# Patient Record
Sex: Female | Born: 1965 | Race: White | Hispanic: Yes | Marital: Married | Smoking: Never smoker
Health system: Southern US, Community
[De-identification: ages and names within clinical notes are randomized; demographics above are authoritative.]

## PROBLEM LIST (undated history)

## (undated) DIAGNOSIS — G43909 Migraine, unspecified, not intractable, without status migrainosus: Secondary | ICD-10-CM

## (undated) HISTORY — DX: Migraine, unspecified, not intractable, without status migrainosus: G43.909

## (undated) HISTORY — PX: APPENDECTOMY: SHX54

---

## 1998-05-29 ENCOUNTER — Encounter: Payer: Self-pay | Admitting: Family Medicine

## 1998-05-29 ENCOUNTER — Ambulatory Visit (HOSPITAL_COMMUNITY): Admission: RE | Admit: 1998-05-29 | Discharge: 1998-05-29 | Payer: Self-pay | Admitting: Family Medicine

## 1998-07-26 ENCOUNTER — Other Ambulatory Visit: Admission: RE | Admit: 1998-07-26 | Discharge: 1998-07-26 | Payer: Self-pay | Admitting: Obstetrics and Gynecology

## 1999-01-24 ENCOUNTER — Inpatient Hospital Stay (HOSPITAL_COMMUNITY): Admission: AD | Admit: 1999-01-24 | Discharge: 1999-01-27 | Payer: Self-pay | Admitting: Obstetrics and Gynecology

## 1999-01-28 ENCOUNTER — Encounter (HOSPITAL_COMMUNITY): Admission: RE | Admit: 1999-01-28 | Discharge: 1999-04-28 | Payer: Self-pay | Admitting: Obstetrics and Gynecology

## 1999-03-09 ENCOUNTER — Other Ambulatory Visit: Admission: RE | Admit: 1999-03-09 | Discharge: 1999-03-09 | Payer: Self-pay | Admitting: Obstetrics & Gynecology

## 2000-08-22 ENCOUNTER — Ambulatory Visit (HOSPITAL_COMMUNITY): Admission: RE | Admit: 2000-08-22 | Discharge: 2000-08-22 | Payer: Self-pay | Admitting: Family Medicine

## 2000-08-22 ENCOUNTER — Encounter: Payer: Self-pay | Admitting: Family Medicine

## 2000-08-22 ENCOUNTER — Other Ambulatory Visit: Admission: RE | Admit: 2000-08-22 | Discharge: 2000-08-22 | Payer: Self-pay | Admitting: Obstetrics and Gynecology

## 2002-01-05 ENCOUNTER — Encounter: Payer: Self-pay | Admitting: Family Medicine

## 2002-01-05 ENCOUNTER — Encounter: Admission: RE | Admit: 2002-01-05 | Discharge: 2002-01-05 | Payer: Self-pay | Admitting: Family Medicine

## 2005-03-08 ENCOUNTER — Encounter: Admission: RE | Admit: 2005-03-08 | Discharge: 2005-03-08 | Payer: Self-pay | Admitting: Family Medicine

## 2005-03-20 ENCOUNTER — Encounter: Admission: RE | Admit: 2005-03-20 | Discharge: 2005-03-20 | Payer: Self-pay | Admitting: Family Medicine

## 2007-03-03 ENCOUNTER — Encounter: Admission: RE | Admit: 2007-03-03 | Discharge: 2007-03-03 | Payer: Self-pay | Admitting: Specialist

## 2010-06-26 ENCOUNTER — Encounter
Admission: RE | Admit: 2010-06-26 | Discharge: 2010-06-26 | Payer: Self-pay | Source: Home / Self Care | Attending: Family Medicine | Admitting: Family Medicine

## 2012-08-24 ENCOUNTER — Encounter: Payer: Self-pay | Admitting: Women's Health

## 2012-08-24 ENCOUNTER — Other Ambulatory Visit (HOSPITAL_COMMUNITY)
Admission: RE | Admit: 2012-08-24 | Discharge: 2012-08-24 | Disposition: A | Discharge status: Home / Self Care | Payer: Managed Care, Other (non HMO) | Source: Ambulatory Visit | Attending: Women's Health | Admitting: Women's Health

## 2012-08-24 ENCOUNTER — Ambulatory Visit (INDEPENDENT_AMBULATORY_CARE_PROVIDER_SITE_OTHER): Payer: Managed Care, Other (non HMO) | Admitting: Women's Health

## 2012-08-24 VITALS — BP 116/70 | Ht 59.25 in | Wt 113.0 lb

## 2012-08-24 DIAGNOSIS — Z1151 Encounter for screening for human papillomavirus (HPV): Secondary | ICD-10-CM | POA: Insufficient documentation

## 2012-08-24 DIAGNOSIS — Z13220 Encounter for screening for lipoid disorders: Secondary | ICD-10-CM

## 2012-08-24 DIAGNOSIS — Z01419 Encounter for gynecological examination (general) (routine) without abnormal findings: Secondary | ICD-10-CM

## 2012-08-24 DIAGNOSIS — E079 Disorder of thyroid, unspecified: Secondary | ICD-10-CM

## 2012-08-24 DIAGNOSIS — N898 Other specified noninflammatory disorders of vagina: Secondary | ICD-10-CM

## 2012-08-24 DIAGNOSIS — N899 Noninflammatory disorder of vagina, unspecified: Secondary | ICD-10-CM

## 2012-08-24 DIAGNOSIS — Z833 Family history of diabetes mellitus: Secondary | ICD-10-CM

## 2012-08-24 LAB — CBC WITH DIFFERENTIAL/PLATELET
Basophils Relative: 0 % (ref 0–1)
Eosinophils Absolute: 0 10*3/uL (ref 0.0–0.7)
Hemoglobin: 14.2 g/dL (ref 12.0–15.0)
MCH: 30.7 pg (ref 26.0–34.0)
MCHC: 34 g/dL (ref 30.0–36.0)
Monocytes Absolute: 0.3 10*3/uL (ref 0.1–1.0)
Monocytes Relative: 5 % (ref 3–12)
Neutrophils Relative %: 67 % (ref 43–77)

## 2012-08-24 LAB — GLUCOSE, RANDOM: Glucose, Bld: 94 mg/dL (ref 70–99)

## 2012-08-24 LAB — LIPID PANEL
Total CHOL/HDL Ratio: 3.2 Ratio
VLDL: 13 mg/dL (ref 0–40)

## 2012-08-24 LAB — WET PREP FOR TRICH, YEAST, CLUE: Clue Cells Wet Prep HPF POC: NONE SEEN

## 2012-08-24 LAB — TSH: TSH: 0.999 u[IU]/mL (ref 0.350–4.500)

## 2012-08-24 MED ORDER — NYSTATIN 100000 UNIT/GM EX CREA: TOPICAL_CREAM | Freq: Two times a day (BID) | CUTANEOUS | Status: DC

## 2012-08-24 NOTE — Progress Notes (Signed)
Beth Wilkerson 1966-03-17 [id number]    History:    New patient presents for annual exam.  21 day cycles, 10 day light flow x 2 cylces/condoms. C/o increased perineal itchiness/irritation x 2 months. Constipation whole life, relieved with Green Tea. Reports scant red rectal layer on toilet tissue occasionally. Pt denies any break through bleeding. Hx Normal PAPs and Mammograms per patient.  Migraine Hx with negative brain scan (2009).   Past medical history, past surgical history, family history and social history were all reviewed and documented in the EPIC chart. Unfaithful husband, 3 yrs ago, negative STI screening after. 73 yo son, Jeanene Erb series completed. 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 Bladder CA (M), living.    ROS:  A  ROS was performed and pertinent positives and negatives are included in the history.  Exam:  Filed Vitals:   08/24/12 0939  BP: 116/70    General appearance:  Normal Head/Neck:  Normal, without cervical or supraclavicular adenopathy. Thyroid:  Symmetrical, normal in size, without palpable masses or nodularity. Respiratory  Effort:  Normal  Auscultation:  Clear without wheezing or rhonchi Cardiovascular  Auscultation:  Regular rate, without rubs, murmurs or gallops  Edema/varicosities:  Not grossly evident Abdominal  Soft,nontender, without masses, guarding or rebound.  Liver/spleen:  No organomegaly noted  Hernia:  None appreciated  Skin  Inspection:  Grossly normal  Palpation:  Grossly normal Neurologic/psychiatric  Orientation:  Normal with appropriate conversation.  Mood/affect:  Normal  Genitourinary    Breasts: Examined lying and sitting.     Right: Without masses, retractions, discharge or axillary adenopathy.     Left: Without masses, retractions, discharge or axillary adenopathy.   Inguinal/mons:  Normal without inguinal adenopathy  External genitalia:  Erythematous from vaginal introitus to  rectal area  BUS/Urethra/Skene's glands:  Normal  Bladder:  Normal  Vagina:  Normal wet prep negative  Cervix:  Normal  Uterus:  normal in size, shape and contour.  Midline and mobile  Adnexa/parametria:     Rt: Without masses or tenderness.   Lt: Without masses or tenderness.  Anus and perineum: Hemorrhoids  Digital rectal exam: Normal sphincter tone without palpated masses or tenderness  Assessment/Plan:  47 y.o.  M HF G1P1 for annual exam with c/o perineal irritation x 2 months.    Perineal irritation Light cycleCycle lasting 10 days x2 months  Plan: PAP,  new guidelines reviewed. Rx Mycolog Cream BID for perineal irritation, instructed to call if no relief. Hemoccult card and instructions given, if positive will schedule colonoscopy.reviewed importance of continuing high fiber diet, green tea to prevent constipation. Sonohysterogram if next cycle > 7 days. Discussed importance of continuing healthy diet, exercise, high fibrous foods, Calcium, Vitamin D, annual mammograms (2012 Normal). Contraception options reviewed, declines will continue condoms. CBC, Lipid pane., TSH, Glucose, UA.  Harrington Challenger WHNP, 10:26 AM 08/24/2012

## 2012-08-24 NOTE — Patient Instructions (Signed)

## 2012-08-25 ENCOUNTER — Telehealth: Payer: Self-pay | Admitting: *Deleted

## 2012-08-25 LAB — URINALYSIS W MICROSCOPIC + REFLEX CULTURE
Hgb urine dipstick: NEGATIVE
Nitrite: NEGATIVE
Protein, ur: NEGATIVE mg/dL
Urobilinogen, UA: 0.2 mg/dL (ref 0.0–1.0)

## 2012-08-25 MED ORDER — NYSTATIN-TRIAMCINOLONE 100000-0.1 UNIT/GM-% EX OINT: TOPICAL_OINTMENT | Freq: Two times a day (BID) | CUTANEOUS | Status: DC

## 2012-08-25 NOTE — Telephone Encounter (Signed)
Pt called and rx for Mycolog Cream BID is not at pharmacy given on OV 08/28/12, rx sent.

## 2012-08-26 LAB — URINE CULTURE
Colony Count: NO GROWTH
Organism ID, Bacteria: NO GROWTH

## 2013-03-11 ENCOUNTER — Ambulatory Visit (INDEPENDENT_AMBULATORY_CARE_PROVIDER_SITE_OTHER): Payer: Managed Care, Other (non HMO) | Admitting: Family Medicine

## 2013-03-11 VITALS — BP 118/78 | HR 70 | Temp 98.2°F | Resp 18 | Ht 59.0 in | Wt 118.6 lb

## 2013-03-11 DIAGNOSIS — R110 Nausea: Secondary | ICD-10-CM

## 2013-03-11 DIAGNOSIS — G43909 Migraine, unspecified, not intractable, without status migrainosus: Secondary | ICD-10-CM

## 2013-03-11 MED ORDER — RIZATRIPTAN BENZOATE 10 MG PO TABS: 10.0000 mg | ORAL_TABLET | ORAL | Status: DC | PRN

## 2013-03-11 MED ORDER — PROMETHAZINE HCL 25 MG PO TABS: 25.0000 mg | ORAL_TABLET | Freq: Three times a day (TID) | ORAL | Status: DC | PRN

## 2013-03-11 MED ORDER — ONDANSETRON HCL 4 MG PO TABS: 4.0000 mg | ORAL_TABLET | Freq: Three times a day (TID) | ORAL | Status: DC | PRN

## 2013-03-11 NOTE — Patient Instructions (Signed)
Migraine Headache A migraine headache is an intense, throbbing pain on one or both sides of your head. A migraine can last for 30 minutes to several hours. CAUSES  The exact cause of a migraine headache is not always known. However, a migraine may be caused when nerves in the brain become irritated and release chemicals that cause inflammation. This causes pain. SYMPTOMS  Pain on one or both sides of your head.  Pulsating or throbbing pain.  Severe pain that prevents daily activities.  Pain that is aggravated by any physical activity.  Nausea, vomiting, or both.  Dizziness.  Pain with exposure to bright lights, loud noises, or activity.  General sensitivity to bright lights, loud noises, or smells. Before you get a migraine, you may get warning signs that a migraine is coming (aura). An aura may include:  Seeing flashing lights.  Seeing bright spots, halos, or zig-zag lines.  Having tunnel vision or blurred vision.  Having feelings of numbness or tingling.  Having trouble talking.  Having muscle weakness. MIGRAINE TRIGGERS  Alcohol.  Smoking.  Stress.  Menstruation.  Aged cheeses.  Foods or drinks that contain nitrates, glutamate, aspartame, or tyramine.  Lack of sleep.  Chocolate.  Caffeine.  Hunger.  Physical exertion.  Fatigue.  Medicines used to treat chest pain (nitroglycerine), birth control pills, estrogen, and some blood pressure medicines. DIAGNOSIS  A migraine headache is often diagnosed based on:  Symptoms.  Physical examination.  A CT scan or MRI of your head. TREATMENT Medicines may be given for pain and nausea. Medicines can also be given to help prevent recurrent migraines.  HOME CARE INSTRUCTIONS  Only take over-the-counter or prescription medicines for pain or discomfort as directed by your caregiver. The use of long-term narcotics is not recommended.  Lie down in a dark, quiet room when you have a migraine.  Keep a journal  to find out what may trigger your migraine headaches. For example, write down:  What you eat and drink.  How much sleep you get.  Any change to your diet or medicines.  Limit alcohol consumption.  Quit smoking if you smoke.  Get 7 to 9 hours of sleep, or as recommended by your caregiver.  Limit stress.  Keep lights dim if bright lights bother you and make your migraines worse. SEEK IMMEDIATE MEDICAL CARE IF:   Your migraine becomes severe.  You have a fever.  You have a stiff neck.  You have vision loss.  You have muscular weakness or loss of muscle control.  You start losing your balance or have trouble walking.  You feel faint or pass out.  You have severe symptoms that are different from your first symptoms. MAKE SURE YOU:   Understand these instructions.  Will watch your condition.  Will get help right away if you are not doing well or get worse. Document Released: 07/01/2005 Document Revised: 09/23/2011 Document Reviewed: 06/21/2011 ExitCare Patient Information 2014 [employer].  

## 2013-03-11 NOTE — Progress Notes (Signed)
[facility]:  Office Visit  Chief Complaint:  Chief Complaint  Patient presents with  . Headache    x2 days, some nausea, some sensitivity to light   . Neck Pain    pt states pain radiates from head down    HPI: Beth Wilkerson is a 47 y.o. female who complains of  2 day history of migraine, has a history of migraines in the past, same sxs of normal migraines. Usually related to menses. Her period has gotten more irregular, LMP August 20, really heavy. The pain in the back was so painful she could not walk. Normally 3 heavy days, 2 more lighter days . Period  stopped 3 days ago. Recent CBC was normal, no anemia. She has nausea, she has light and noise sensitivty . She has neck pain with her HAS, Lots of stressors. 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 + nausea, no vomiting   Past Medical History  Diagnosis Date  . Migraines    Past Surgical History  Procedure Laterality Date  . Appendectomy     History   Social History  . Marital Status: Married    Spouse Name: N/A    Number of Children: N/A  . Years of Education: N/A   Social History Main Topics  . Smoking status: Never Smoker   . Smokeless tobacco: Never Used  . Alcohol Use: Yes     Comment: q28months  . Drug Use: No  . Sexual Activity: Yes    Birth Control/ Protection: Condom   Other Topics Concern  . None   Social History Narrative  . None   Family History  Problem Relation Age of Onset  . Cancer Mother     bladder   No Known Allergies Prior to Admission medications   Medication Sig Start Date End Date Taking? Authorizing Provider  CALCIUM PO Take by mouth.   Yes Historical Provider, MD  Cholecalciferol (VITAMIN D-3 PO) Take by mouth.   Yes Historical Provider, MD  Multiple Vitamins-Minerals (MULTIVITAMIN PO) Take by mouth. For hair and nails   Yes Historical Provider, MD  Niacin (VITAMIN B-3 PO) Take by mouth.    Yes Historical Provider, MD  NIACIN CR PO Take by mouth.   Yes Historical Provider, MD  nystatin-triamcinolone ointment (MYCOLOG) Apply topically 2 (two) times daily. 08/25/12   Harrington Challenger, NP     ROS: The patient denies fevers, chills, night sweats, unintentional weight loss, chest pain, palpitations, wheezing, dyspnea on exertion, , vomiting, abdominal pain, dysuria, hematuria, melena, numbness, weakness, or tingling.   All other systems have been reviewed and were otherwise negative with the exception of those mentioned in the HPI and as above.    PHYSICAL EXAM: Filed Vitals:   03/11/13 1504  BP: 118/78  Pulse: 70  Temp: 98.2 F (36.8 C)  Resp: 18   Filed Vitals:   03/11/13 1504  Height: 4\' 11"  (1.499 m)  Weight: 118 lb 9.6 oz (53.797 kg)   Body mass index is 23.94 kg/(m^2).  General: Alert, no acute distress HEENT:  Normocephalic, atraumatic, oropharynx patent. EOMI, PERRLA, fundoscopic exam nl Cardiovascular:  Regular rate and rhythm, no rubs murmurs or gallops.  No Carotid bruits, radial pulse intact. No pedal edema.  Respiratory: Clear to auscultation bilaterally.  No wheezes, rales, or rhonchi.  No cyanosis, no use of accessory musculature GI: No organomegaly, abdomen is soft and non-tender, positive  bowel sounds.  No masses. Skin: No rashes. Neurologic: Facial musculature symmetric. CN 2-12 grossly intact Psychiatric: Patient is appropriate throughout our interaction. Lymphatic: No cervical lymphadenopathy Musculoskeletal: Gait intact. Neck ROM intact, no nuchal rigidity, 5/5 strength, 2/2 DTRs   LABS: Results for orders placed in visit on 08/24/12  WET PREP FOR TRICH, YEAST, CLUE      Result Value Range   Yeast Wet Prep HPF POC NONE SEEN  NONE SEEN   Trich, Wet Prep NONE SEEN  NONE SEEN   Clue Cells Wet Prep HPF POC NONE SEEN  NONE SEEN   WBC, Wet Prep HPF POC NONE SEEN  NONE SEEN  URINE CULTURE      Result Value Range   Colony Count NO GROWTH     Organism  ID, Bacteria NO GROWTH    CBC WITH DIFFERENTIAL      Result Value Range   WBC 5.2  4.0 - 10.5 K/uL   RBC 4.62  3.87 - 5.11 MIL/uL   Hemoglobin 14.2  12.0 - 15.0 g/dL   HCT 45.4  09.8 - 11.9 %   MCV 90.5  78.0 - 100.0 fL   MCH 30.7  26.0 - 34.0 pg   MCHC 34.0  30.0 - 36.0 g/dL   RDW 14.7  82.9 - 56.2 %   Platelets 152  150 - 400 K/uL   Neutrophils Relative % 67  43 - 77 %   Neutro Abs 3.5  1.7 - 7.7 K/uL   Lymphocytes Relative 27  12 - 46 %   Lymphs Abs 1.4  0.7 - 4.0 K/uL   Monocytes Relative 5  3 - 12 %   Monocytes Absolute 0.3  0.1 - 1.0 K/uL   Eosinophils Relative 1  0 - 5 %   Eosinophils Absolute 0.0  0.0 - 0.7 K/uL   Basophils Relative 0  0 - 1 %   Basophils Absolute 0.0  0.0 - 0.1 K/uL   Smear Review Criteria for review not met    GLUCOSE, RANDOM      Result Value Range   Glucose, Bld 94  70 - 99 mg/dL  LIPID PANEL      Result Value Range   Cholesterol 227 (*) 0 - 200 mg/dL   Triglycerides 65  <130 mg/dL   HDL 71  >86 mg/dL   Total CHOL/HDL Ratio 3.2     VLDL 13  0 - 40 mg/dL   LDL Cholesterol 578 (*) 0 - 99 mg/dL  TSH      Result Value Range   TSH 0.999  0.350 - 4.500 uIU/mL  URINALYSIS WITH CULTURE REFLEX      Result Value Range   Color, Urine YELLOW  YELLOW   APPearance CLEAR  CLEAR   Specific Gravity, Urine 1.017  1.005 - 1.030   pH 5.5  5.0 - 8.0   Glucose, UA NEG  NEG mg/dL   Bilirubin Urine NEG  NEG   Ketones, ur NEG  NEG mg/dL   Hgb urine dipstick NEG  NEG   Protein, ur NEG  NEG mg/dL   Urobilinogen, UA 0.2  0.0 - 1.0 mg/dL   Nitrite NEG  NEG   Leukocytes, UA TRACE (*) NEG   Squamous Epithelial / LPF NONE SEEN  RARE   Crystals NONE SEEN  NONE SEEN   Casts NONE SEEN  NONE SEEN   WBC, UA 0-2  <3 WBC/hpf   RBC / HPF 0-2  <3 RBC/hpf   Bacteria,  UA NONE SEEN  RARE     EKG/XRAY:   Primary read interpreted by Dr. Conley Rolls at [facility].   ASSESSMENT/PLAN: Encounter Diagnoses  Name Primary?  . Migraine Yes  . Nausea alone    Rx maxalt Rx Zofran and  also PRomethaxine, may use one or the other depending on if she does not want to be drowsy C/w Ibuprofen prn Decline NSAIDs IM Gross sideeffects, risk and benefits, and alternatives of medications d/w patient. Patient is aware that all medications have potential sideeffects and we are unable to predict every sideeffect or drug-drug interaction that may occur.  Hamilton Capri PHUONG, DO 03/13/2013 5:06 PM

## 2013-07-19 ENCOUNTER — Other Ambulatory Visit: Payer: Self-pay

## 2013-07-19 ENCOUNTER — Other Ambulatory Visit: Payer: Self-pay | Admitting: Family Medicine

## 2013-07-19 DIAGNOSIS — Z1231 Encounter for screening mammogram for malignant neoplasm of breast: Secondary | ICD-10-CM

## 2013-08-10 ENCOUNTER — Ambulatory Visit: Payer: Self-pay

## 2013-08-26 ENCOUNTER — Ambulatory Visit
Admission: RE | Admit: 2013-08-26 | Discharge: 2013-08-26 | Disposition: A | Discharge status: Home / Self Care | Payer: BC Managed Care – PPO | Source: Ambulatory Visit

## 2013-08-26 ENCOUNTER — Encounter: Payer: Self-pay | Admitting: Women's Health

## 2013-08-26 ENCOUNTER — Ambulatory Visit (INDEPENDENT_AMBULATORY_CARE_PROVIDER_SITE_OTHER): Payer: BC Managed Care – PPO | Admitting: Women's Health

## 2013-08-26 VITALS — BP 108/68 | Ht 59.0 in | Wt 116.4 lb

## 2013-08-26 DIAGNOSIS — K649 Unspecified hemorrhoids: Secondary | ICD-10-CM

## 2013-08-26 DIAGNOSIS — Z13220 Encounter for screening for lipoid disorders: Secondary | ICD-10-CM

## 2013-08-26 DIAGNOSIS — Z1231 Encounter for screening mammogram for malignant neoplasm of breast: Secondary | ICD-10-CM

## 2013-08-26 DIAGNOSIS — Z01419 Encounter for gynecological examination (general) (routine) without abnormal findings: Secondary | ICD-10-CM

## 2013-08-26 DIAGNOSIS — Z833 Family history of diabetes mellitus: Secondary | ICD-10-CM

## 2013-08-26 LAB — CBC WITH DIFFERENTIAL/PLATELET
BASOS ABS: 0 10*3/uL (ref 0.0–0.1)
Basophils Relative: 0 % (ref 0–1)
EOS ABS: 0 10*3/uL (ref 0.0–0.7)
EOS PCT: 1 % (ref 0–5)
HCT: 43 % (ref 36.0–46.0)
HEMOGLOBIN: 14.4 g/dL (ref 12.0–15.0)
Lymphocytes Relative: 34 % (ref 12–46)
Lymphs Abs: 1.3 10*3/uL (ref 0.7–4.0)
MCH: 30.6 pg (ref 26.0–34.0)
MCHC: 33.5 g/dL (ref 30.0–36.0)
MCV: 91.3 fL (ref 78.0–100.0)
MONOS PCT: 7 % (ref 3–12)
Monocytes Absolute: 0.3 10*3/uL (ref 0.1–1.0)
Neutro Abs: 2.3 10*3/uL (ref 1.7–7.7)
Neutrophils Relative %: 58 % (ref 43–77)
Platelets: 141 10*3/uL — ABNORMAL LOW (ref 150–400)
RBC: 4.71 MIL/uL (ref 3.87–5.11)
RDW: 13.1 % (ref 11.5–15.5)
WBC: 3.9 10*3/uL — ABNORMAL LOW (ref 4.0–10.5)

## 2013-08-26 LAB — URINALYSIS W MICROSCOPIC + REFLEX CULTURE
BACTERIA UA: NONE SEEN
BILIRUBIN URINE: NEGATIVE
CASTS: NONE SEEN
Crystals: NONE SEEN
Glucose, UA: NEGATIVE mg/dL
HGB URINE DIPSTICK: NEGATIVE
KETONES UR: NEGATIVE mg/dL
NITRITE: NEGATIVE
PROTEIN: NEGATIVE mg/dL
SPECIFIC GRAVITY, URINE: 1.019 (ref 1.005–1.030)
UROBILINOGEN UA: 0.2 mg/dL (ref 0.0–1.0)
pH: 5.5 (ref 5.0–8.0)

## 2013-08-26 LAB — GLUCOSE, RANDOM: GLUCOSE: 79 mg/dL (ref 70–99)

## 2013-08-26 LAB — LIPID PANEL
Cholesterol: 206 mg/dL — ABNORMAL HIGH (ref 0–200)
HDL: 71 mg/dL (ref >39)
LDL Cholesterol: 121 mg/dL — ABNORMAL HIGH (ref 0–99)
Total CHOL/HDL Ratio: 2.9 Ratio
Triglycerides: 71 mg/dL (ref <150)
VLDL: 14 mg/dL (ref 0–40)

## 2013-08-26 MED ORDER — HYDROCORTISONE ACE-PRAMOXINE 2.5-1 % RE CREA: 1.0000 "application " | TOPICAL_CREAM | Freq: Three times a day (TID) | RECTAL | Status: DC

## 2013-08-26 MED ORDER — HYDROCORTISONE ACE-PRAMOXINE 2.5-1 % RE CREA
1.0000 | TOPICAL_CREAM | Freq: Three times a day (TID) | RECTAL | Status: DC
Start: 2013-08-26 — End: 2014-01-14

## 2013-08-26 NOTE — Progress Notes (Signed)
Beth Wilkerson 07-Oct-1965 [id number]    History:    Presents for annual exam.  Reports cycles as becoming irregular but still monthly/ condoms. Normal Pap history, mammogram overdue scheduled today.   Past medical history, past surgical history, family history and social history were all reviewed and documented in the EPIC chart. Cain Sieve 13 doing well. 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 Mother bladder cancer doing well. Father deceased old age. 2007/11/06 negative brain scan for migraines.   ROS:  A  ROS was performed and pertinent positives and negatives are included.  Exam:  Filed Vitals:   08/26/13 0833  BP: 108/68    General appearance:  Normal Thyroid:  Symmetrical, normal in size, without palpable masses or nodularity. Respiratory  Auscultation:  Clear without wheezing or rhonchi Cardiovascular  Auscultation:  Regular rate, without rubs, murmurs or gallops  Edema/varicosities:  Not grossly evident Abdominal  Soft,nontender, without masses, guarding or rebound.  Liver/spleen:  No organomegaly noted  Hernia:  None appreciated  Skin  Inspection:  Grossly normal   Breasts: Examined lying and sitting.     Right: Without masses, retractions, discharge or axillary adenopathy.     Left: Without masses, retractions, discharge or axillary adenopathy. Gentitourinary   Inguinal/mons:  Normal without inguinal adenopathy  External genitalia:  Normal  BUS/Urethra/Skene's glands:  Normal  Vagina:  Normal  Cervix:  Normal  Uterus:   normal in size, shape and contour.  Midline and mobile  Adnexa/parametria:     Rt: Without masses or tenderness.   Lt: Without masses or tenderness.  Anus and perineum: Small external hemorrhoid  Digital rectal exam: Normal sphincter tone without palpated masses or tenderness  Assessment/Plan:  48 y.o. MHF G1P1 for annual exam.    Normal perimenopausal exam/condoms Hemorrhoids  Plan: Analpram HC to use as needed prescription, proper use reviewed.  SBE's, reviewed importance of annual screen. Increase regular exercise, calcium rich diet, vitamin D 1000 daily encouraged. CBC, lipid panel, glucose, UA,. Pap normal with negative HR HPV 11/05/12.    [facility], 12:55 PM 08/26/2013

## 2013-08-26 NOTE — Patient Instructions (Signed)

## 2013-08-27 LAB — URINE CULTURE
Colony Count: NO GROWTH
Organism ID, Bacteria: NO GROWTH

## 2013-12-15 ENCOUNTER — Other Ambulatory Visit: Payer: Self-pay | Admitting: Physician Assistant

## 2014-01-12 ENCOUNTER — Other Ambulatory Visit: Payer: Self-pay | Admitting: Physician Assistant

## 2014-01-14 ENCOUNTER — Ambulatory Visit (INDEPENDENT_AMBULATORY_CARE_PROVIDER_SITE_OTHER): Payer: BC Managed Care – PPO | Admitting: Physician Assistant

## 2014-01-14 VITALS — BP 125/76 | HR 80 | Temp 97.9°F | Resp 18 | Ht 59.0 in | Wt 113.0 lb

## 2014-01-14 DIAGNOSIS — N951 Menopausal and female climacteric states: Secondary | ICD-10-CM

## 2014-01-14 DIAGNOSIS — G43909 Migraine, unspecified, not intractable, without status migrainosus: Secondary | ICD-10-CM

## 2014-01-14 MED ORDER — RIZATRIPTAN BENZOATE 10 MG PO TABS: 10.0000 mg | ORAL_TABLET | ORAL | Status: DC | PRN

## 2014-01-14 NOTE — Patient Instructions (Signed)
Continue using Maxalt as needed for migraines.  Check a pregnancy test in a couple of weeks.  Continue using condoms consistently  When you turn 49 we can schedule you for a bone density test and colonoscopy   Migraine Headache A migraine headache is an intense, throbbing pain on one or both sides of your head. A migraine can last for 30 minutes to several hours. CAUSES  The exact cause of a migraine headache is not always known. However, a migraine may be caused when nerves in the brain become irritated and release chemicals that cause inflammation. This causes pain. Certain things may also trigger migraines, such as:  Alcohol.  Smoking.  Stress.  Menstruation.  Aged cheeses.  Foods or drinks that contain nitrates, glutamate, aspartame, or tyramine.  Lack of sleep.  Chocolate.  Caffeine.  Hunger.  Physical exertion.  Fatigue.  Medicines used to treat chest pain (nitroglycerine), birth control pills, estrogen, and some blood pressure medicines. SIGNS AND SYMPTOMS  Pain on one or both sides of your head.  Pulsating or throbbing pain.  Severe pain that prevents daily activities.  Pain that is aggravated by any physical activity.  Nausea, vomiting, or both.  Dizziness.  Pain with exposure to bright lights, loud noises, or activity.  General sensitivity to bright lights, loud noises, or smells. Before you get a migraine, you may get warning signs that a migraine is coming (aura). An aura may include:  Seeing flashing lights.  Seeing bright spots, halos, or zig-zag lines.  Having tunnel vision or blurred vision.  Having feelings of numbness or tingling.  Having trouble talking.  Having muscle weakness. DIAGNOSIS  A migraine headache is often diagnosed based on:  Symptoms.  Physical exam.  A CT scan or MRI of your head. These imaging tests cannot diagnose migraines, but they can help rule out other causes of headaches. TREATMENT Medicines may be  given for pain and nausea. Medicines can also be given to help prevent recurrent migraines.  HOME CARE INSTRUCTIONS  Only take over-the-counter or prescription medicines for pain or discomfort as directed by your health care provider. The use of long-term narcotics is not recommended.  Lie down in a dark, quiet room when you have a migraine.  Keep a journal to find out what may trigger your migraine headaches. For example, write down:  What you eat and drink.  How much sleep you get.  Any change to your diet or medicines.  Limit alcohol consumption.  Quit smoking if you smoke.  Get 7-9 hours of sleep, or as recommended by your health care provider.  Limit stress.  Keep lights dim if bright lights bother you and make your migraines worse. SEEK IMMEDIATE MEDICAL CARE IF:   Your migraine becomes severe.  You have a fever.  You have a stiff neck.  You have vision loss.  You have muscular weakness or loss of muscle control.  You start losing your balance or have trouble walking.  You feel faint or pass out.  You have severe symptoms that are different from your first symptoms. MAKE SURE YOU:   Understand these instructions.  Will watch your condition.  Will get help right away if you are not doing well or get worse. Document Released: 07/01/2005 Document Revised: 04/21/2013 Document Reviewed: 03/08/2013 [facility] Patient Information 2015 [place], [place]. This information is not intended to replace advice given to you by your health care provider. Make sure you discuss any questions you have with your health care provider.

## 2014-01-14 NOTE — Progress Notes (Signed)
   Subjective:    Patient ID: Beth Wilkerson, female    DOB: 05-15-1966, 48 y.o.   MRN: [id number]  HPI   Beth Wilkerson is a very pleasant 48 yr old female here for refill on Maxalt.  Pt reports she has suffered with migraines since childhood.  She tried many medications without success.  She states maxalt works VERY well!  She is pleased to be suffering with HAs the way she was.  HAs have always been associated with her menstrual cycle.    She has some questions regarding her menstrual cycle.  She is becoming more irregular.  LMP about 2 wks ago but only a scant amount.  Prior to that it had been 2 months since her last period.  She uses condoms only for contraception - has not been able to tolerate pills.  The irregularity of her periods has made her nervous that she could be pregnant  She also is interested in bone density screening - but ins will not pay for this until she is 63.  She wonders what other health screenings she needs   Review of Systems  Constitutional: Negative.   Respiratory: Negative.   Cardiovascular: Negative.   Genitourinary: Positive for menstrual problem (irregular periods).  Neurological: Negative for headaches.       Objective:   Physical Exam  Vitals reviewed. Constitutional: She is oriented to person, place, and time. She appears well-developed and well-nourished. No distress.  HENT:  Head: Atraumatic.  Pulmonary/Chest: Effort normal.  Neurological: She is alert and oriented to person, place, and time.  Skin: Skin is warm and dry.  Psychiatric: She has a normal mood and affect. Her behavior is normal.       Assessment & Plan:  1. Migraine without status migrainosus, not intractable, unspecified migraine type Beth Wilkerson is a very pleasant 48 yr old female with chronic migraines.  She gets very good relief with maxalt.  Medication has been refilled  - rizatriptan (MAXALT) 10 MG tablet; Take 1 tablet (10 mg total) by mouth as needed for migraine. May  repeat in 2 hours if needed  Dispense: 10 tablet; Refill: 5  2. Perimenopause Discussed with pt that she is currently perimenopausal and will be menopausal when she has not had a period in 12 months.  We discussed that it is still technically possible for her to become pregnant.  Encouraged continued condom use.  She and her husband did have unprotected sex about 1 week ago - she will take a home test in in the next couple of weeks  We discussed the need for colonoscopy at age 44.  At that point we could also discuss bone density screening, though USPSTF recommends only for women >65   E. Natividad Brood MHS, PA-C [facility] 7/3/20155:33 PM

## 2014-01-19 ENCOUNTER — Ambulatory Visit (INDEPENDENT_AMBULATORY_CARE_PROVIDER_SITE_OTHER): Payer: BC Managed Care – PPO | Admitting: Emergency Medicine

## 2014-01-19 VITALS — BP 116/60 | HR 71 | Temp 98.0°F | Resp 18 | Ht 59.0 in | Wt 114.0 lb

## 2014-01-19 DIAGNOSIS — S025XXB Fracture of tooth (traumatic), initial encounter for open fracture: Secondary | ICD-10-CM

## 2014-01-19 DIAGNOSIS — S025XXA Fracture of tooth (traumatic), initial encounter for closed fracture: Secondary | ICD-10-CM

## 2014-01-19 MED ORDER — CEPHALEXIN 500 MG PO CAPS
500.0000 mg | ORAL_CAPSULE | Freq: Four times a day (QID) | ORAL | Status: DC
Start: 2014-01-19 — End: 2014-08-30

## 2014-01-19 MED ORDER — ACETAMINOPHEN-CODEINE #3 300-30 MG PO TABS: 1.0000 | ORAL_TABLET | ORAL | Status: DC | PRN

## 2014-01-19 NOTE — Patient Instructions (Signed)
Dental Fracture °You have a dental fracture or injury. This can mean the tooth is loose, has a chip in the enamel or is broken. If just the outer enamel is chipped, there is a good chance the tooth will not become infected. The only treatment needed may be to smooth off a rough edge. Fractures into the deeper layers (dentin and pulp) cause greater pain and are more likely to become infected. These require you to see a dentist as soon as possible to save the tooth. °Loose teeth may need to be wired or bonded with a plastic splint to hold them in place. A paste may be painted on the open area of the broken tooth to reduce the pain. Antibiotics and pain medicine may be prescribed. Choosing a soft or liquid diet and rinsing the mouth out with warm water after meals may be helpful. °See your dentist as recommended. Failure to seek care or follow up with a dentist or other specialist as recommended could result in the loss of your tooth, infection, or permanent dental problems. °SEEK MEDICAL CARE IF:  °· You have increased pain not controlled with medicines. °· You have swelling around the tooth, in the face or neck. °· You have bleeding which starts, continues, or gets worse. °· You have a fever. °Document Released: 08/08/2004 Document Revised: 09/23/2011 Document Reviewed: 05/23/2009 °Exit[employer]® Patient Information ©2015 [employer]. This information is not intended to replace advice given to you by your health care provider. Make sure you discuss any questions you have with your health care provider. ° °

## 2014-01-19 NOTE — Progress Notes (Signed)
[facility] [address] [place] [address] [phone]  Date:  01/19/2014   Name:  Beth Wilkerson   DOB:  May 19, 1966   MRN:  [id number]  PCP:  No PCP Per Patient    Chief Complaint: Dental Pain   History of Present Illness:  Beth Wilkerson is a 48 y.o. very pleasant female patient who presents with the following:  History of a fractured tooth years ago.  Now has a four day history of pain in the right upper jaw which has worsened.  Has thermal sensitivity.  No fever or chills, nausea or vomiting.  No facial swelling.  No improvement with over the counter medications or other home remedies. Denies other complaint or health concern today.   Patient Active Problem List   Diagnosis Date Noted  . Hemorrhoids 08/26/2013    Past Medical History  Diagnosis Date  . Migraines     Past Surgical History  Procedure Laterality Date  . Appendectomy      History  Substance Use Topics  . Smoking status: Never Smoker   . Smokeless tobacco: Never Used  . Alcohol Use: Yes     Comment: q45months    Family History  Problem Relation Age of Onset  . Cancer Mother     bladder    No Known Allergies  Medication list has been reviewed and updated.  Current Outpatient Prescriptions on File Prior to Visit  Medication Sig Dispense Refill  . CALCIUM PO Take by mouth.      . Cholecalciferol (VITAMIN D-3 PO) Take by mouth.      . Multiple Vitamins-Minerals (MULTIVITAMIN PO) Take by mouth. For hair and nails      . Niacin (VITAMIN B-3 PO) Take by mouth.      . promethazine (PHENERGAN) 25 MG tablet Take 1 tablet (25 mg total) by mouth every 8 (eight) hours as needed for nausea.  20 tablet  0  . rizatriptan (MAXALT) 10 MG tablet Take 1 tablet (10 mg total) by mouth as needed for migraine. May repeat in 2 hours if needed  10 tablet  5   No current facility-administered medications on file prior to visit.    Review of Systems:  As per HPI, otherwise negative.     Physical Examination: Filed Vitals:   01/19/14 1729  BP: 116/60  Pulse: 71  Temp: 98 F (36.7 C)  Resp: 18   Filed Vitals:   01/19/14 1729  Height: 4\' 11"  (1.499 m)  Weight: 114 lb (51.71 kg)   Body mass index is 23.01 kg/(m^2). Ideal Body Weight: Weight in (lb) to have BMI = 25: 123.5   GEN: WDWN, NAD, Non-toxic, Alert & Oriented x 3 HEENT: Atraumatic, Normocephalic.  Fracture #3. Ears and Nose: No external deformity. EXTR: No clubbing/cyanosis/edema NEURO: Normal gait.  PSYCH: Normally interactive. Conversant. Not depressed or anxious appearing.  Calm demeanor.    Assessment and Plan: Fracture tooth Keflex Dentist tyl #3  Signed,  Ellison Carwin, MD

## 2014-02-04 ENCOUNTER — Other Ambulatory Visit: Payer: Self-pay

## 2014-02-04 DIAGNOSIS — Z1231 Encounter for screening mammogram for malignant neoplasm of breast: Secondary | ICD-10-CM

## 2014-02-10 ENCOUNTER — Telehealth: Payer: Self-pay

## 2014-02-10 NOTE — Telephone Encounter (Signed)
PA needed for quantity limit problem. Called pt who stated that she is having fewer migraines so she thinks she can get by w/fewer tablets per month. Called pharmacy to have them try to run fewer tablets but could not get through to them. Waited through multiple rounds of vm w/out any success with getting an answer. Will try again tomorrow.

## 2014-02-11 NOTE — Telephone Encounter (Signed)
Reached [facility] pharmacist and was advised that the Rx actually went through ins for #10, so no PA needed.

## 2014-08-30 ENCOUNTER — Encounter: Payer: Self-pay | Admitting: Women's Health

## 2014-08-30 ENCOUNTER — Ambulatory Visit
Admission: RE | Admit: 2014-08-30 | Discharge: 2014-08-30 | Disposition: A | Discharge status: Home / Self Care | Payer: BLUE CROSS/BLUE SHIELD | Source: Ambulatory Visit

## 2014-08-30 ENCOUNTER — Ambulatory Visit (INDEPENDENT_AMBULATORY_CARE_PROVIDER_SITE_OTHER): Payer: BLUE CROSS/BLUE SHIELD | Admitting: Women's Health

## 2014-08-30 VITALS — BP 116/74 | Ht 59.0 in | Wt 119.0 lb

## 2014-08-30 DIAGNOSIS — Z01419 Encounter for gynecological examination (general) (routine) without abnormal findings: Secondary | ICD-10-CM

## 2014-08-30 DIAGNOSIS — Z1231 Encounter for screening mammogram for malignant neoplasm of breast: Secondary | ICD-10-CM

## 2014-08-30 DIAGNOSIS — Z13220 Encounter for screening for lipoid disorders: Secondary | ICD-10-CM

## 2014-08-30 LAB — CBC WITH DIFFERENTIAL/PLATELET
Basophils Absolute: 0 10*3/uL (ref 0.0–0.1)
Basophils Relative: 0 % (ref 0–1)
Eosinophils Absolute: 0 10*3/uL (ref 0.0–0.7)
Eosinophils Relative: 1 % (ref 0–5)
HCT: 42.2 % (ref 36.0–46.0)
HEMOGLOBIN: 13.8 g/dL (ref 12.0–15.0)
LYMPHS PCT: 29 % (ref 12–46)
Lymphs Abs: 1.2 10*3/uL (ref 0.7–4.0)
MCH: 30.1 pg (ref 26.0–34.0)
MCHC: 32.7 g/dL (ref 30.0–36.0)
MCV: 91.9 fL (ref 78.0–100.0)
MPV: 12.1 fL (ref 8.6–12.4)
Monocytes Absolute: 0.3 10*3/uL (ref 0.1–1.0)
Monocytes Relative: 8 % (ref 3–12)
Neutro Abs: 2.5 10*3/uL (ref 1.7–7.7)
Neutrophils Relative %: 62 % (ref 43–77)
PLATELETS: 132 10*3/uL — AB (ref 150–400)
RBC: 4.59 MIL/uL (ref 3.87–5.11)
RDW: 13.8 % (ref 11.5–15.5)
WBC: 4 10*3/uL (ref 4.0–10.5)

## 2014-08-30 NOTE — Patient Instructions (Signed)

## 2014-08-30 NOTE — Progress Notes (Signed)
Beth Wilkerson Oct 19, 1965 [id number]    History:    Presents for annual exam.  Irregular cycles every 1-3 months for 5 days for the past year. Occasional hot flushes. Condoms. Normal Pap and mammogram history.  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 Mother bladder cancer survivor.  ROS:  A ROS was performed and pertinent positives and negatives are included.  Exam:  Filed Vitals:   08/30/14 0828  BP: 116/74    General appearance:  Normal Thyroid:  Symmetrical, normal in size, without palpable masses or nodularity. Respiratory  Auscultation:  Clear without wheezing or rhonchi Cardiovascular  Auscultation:  Regular rate, without rubs, murmurs or gallops  Edema/varicosities:  Not grossly evident Abdominal  Soft,nontender, without masses, guarding or rebound.  Liver/spleen:  No organomegaly noted  Hernia:  None appreciated  Skin  Inspection:  Grossly normal   Breasts: Examined lying and sitting.     Right: Without masses, retractions, discharge or axillary adenopathy.     Left: Without masses, retractions, discharge or axillary adenopathy. Gentitourinary   Inguinal/mons:  Normal without inguinal adenopathy  External genitalia:  Normal  BUS/Urethra/Skene's glands:  Normal  Vagina:  Normal  Cervix:  Normal  Uterus:   normal in size, shape and contour.  Midline and mobile  Adnexa/parametria:     Rt: Without masses or tenderness.   Lt: Without masses or tenderness.  Anus and perineum: Normal  Digital rectal exam: Normal sphincter tone without palpated masses or tenderness  Assessment/Plan:  49 y.o. MHF G1P1 for annual exam with no complaints.  Perimenopausal/irregular cycles-condoms  Plan: SBE's, continue annual mammogram 3-D, history of dense breasts. Regular exercise, calcium rich diet, vitamin D 1000 daily encouraged. Contraception  options reviewed declined will continue condoms. Menopause reviewed, return to office if cycles space greater than 3 months. CBC, CMP, lipid panel, UA, Pap normal with negative HR HPV 2014, new screening guidelines reviewed.  Huel Cote [facility], 9:07 AM 08/30/2014

## 2014-08-31 LAB — COMPREHENSIVE METABOLIC PANEL
ALBUMIN: 4.1 g/dL (ref 3.5–5.2)
ALK PHOS: 65 U/L (ref 39–117)
ALT: 19 U/L (ref 0–35)
AST: 21 U/L (ref 0–37)
BILIRUBIN TOTAL: 1.2 mg/dL (ref 0.2–1.2)
BUN: 14 mg/dL (ref 6–23)
CO2: 24 mEq/L (ref 19–32)
Calcium: 8.8 mg/dL (ref 8.4–10.5)
Chloride: 104 mEq/L (ref 96–112)
Creat: 0.67 mg/dL (ref 0.50–1.10)
Glucose, Bld: 78 mg/dL (ref 70–99)
POTASSIUM: 3.6 meq/L (ref 3.5–5.3)
Sodium: 139 mEq/L (ref 135–145)
Total Protein: 6.7 g/dL (ref 6.0–8.3)

## 2014-08-31 LAB — URINALYSIS W MICROSCOPIC + REFLEX CULTURE
Bacteria, UA: NONE SEEN
Bilirubin Urine: NEGATIVE
CASTS: NONE SEEN
CRYSTALS: NONE SEEN
Glucose, UA: NEGATIVE mg/dL
Hgb urine dipstick: NEGATIVE
KETONES UR: NEGATIVE mg/dL
Leukocytes, UA: NEGATIVE
Nitrite: NEGATIVE
Protein, ur: NEGATIVE mg/dL
SQUAMOUS EPITHELIAL / LPF: NONE SEEN
Specific Gravity, Urine: 1.02 (ref 1.005–1.030)
Urobilinogen, UA: 0.2 mg/dL (ref 0.0–1.0)
pH: 5 (ref 5.0–8.0)

## 2014-08-31 LAB — LIPID PANEL
CHOL/HDL RATIO: 3.1 ratio
Cholesterol: 236 mg/dL — ABNORMAL HIGH (ref 0–200)
HDL: 76 mg/dL (ref >39)
LDL CALC: 149 mg/dL — AB (ref 0–99)
Triglycerides: 56 mg/dL (ref <150)
VLDL: 11 mg/dL (ref 0–40)

## 2014-09-01 ENCOUNTER — Other Ambulatory Visit: Payer: Self-pay | Admitting: Women's Health

## 2014-09-01 DIAGNOSIS — D696 Thrombocytopenia, unspecified: Secondary | ICD-10-CM

## 2014-09-20 ENCOUNTER — Other Ambulatory Visit: Payer: Self-pay | Admitting: Family Medicine

## 2014-11-10 ENCOUNTER — Other Ambulatory Visit: Payer: Self-pay | Admitting: Family Medicine

## 2014-11-14 ENCOUNTER — Ambulatory Visit (INDEPENDENT_AMBULATORY_CARE_PROVIDER_SITE_OTHER): Payer: BLUE CROSS/BLUE SHIELD | Admitting: Family Medicine

## 2014-11-14 VITALS — BP 122/76 | HR 66 | Temp 98.0°F | Resp 16 | Ht 60.0 in | Wt 123.8 lb

## 2014-11-14 DIAGNOSIS — N912 Amenorrhea, unspecified: Secondary | ICD-10-CM

## 2014-11-14 DIAGNOSIS — G43009 Migraine without aura, not intractable, without status migrainosus: Secondary | ICD-10-CM | POA: Diagnosis not present

## 2014-11-14 LAB — POCT URINE PREGNANCY: PREG TEST UR: NEGATIVE

## 2014-11-14 MED ORDER — RIZATRIPTAN BENZOATE 10 MG PO TABS: ORAL_TABLET | ORAL | Status: DC

## 2014-11-14 NOTE — Telephone Encounter (Signed)
Spoke with patient she will be coming in walk in clinic for follow up for medication refill this week.

## 2014-11-14 NOTE — Progress Notes (Signed)
[facility] [place], [place] [address] [phone]  Date:  11/14/2014   Name:  Beth Wilkerson   DOB:  August 22, 1965   MRN:  [id number]  PCP:  No PCP Per Patient    Chief Complaint: Medication Refill   History of Present Illness:  Beth Wilkerson is a 49 y.o. very pleasant female patient who presents with the following:  She needs a refill of her maxalt.  She uses this as needed for migraine HA.  She uses maxalt when she gets occasional migraine. Her migraines seemed to occur with her menses; however she has stopped her menses now, and may get a migraine when she is "tense or worried."  She might have 3 in a week, then nothing for weeks.  The maxalt works "perfect" for her.  She has had migraine for a long time and this is the best thing that she has found.  When she has migraine she has light and noise sensitvity, and will vomit at times.    She notes very occasional aura but usually does not have any aura Her menses seem to be spacing out.  LMP was about 2 months ago and was very light.  Pregnancy unlikely due to her age but she would like to take a test to be sure   Patient Active Problem List   Diagnosis Date Noted  . Hemorrhoids 08/26/2013    Past Medical History  Diagnosis Date  . Migraines     Past Surgical History  Procedure Laterality Date  . Appendectomy      History  Substance Use Topics  . Smoking status: Never Smoker   . Smokeless tobacco: Never Used  . Alcohol Use: 0.0 oz/week    0 Standard drinks or equivalent per week     Comment: Rare    Family History  Problem Relation Age of Onset  . Cancer Mother     bladder    No Known Allergies  Medication list has been reviewed and updated.  Current Outpatient Prescriptions on File Prior to Visit  Medication Sig Dispense Refill  . rizatriptan (MAXALT) 10 MG tablet Take 1 tab by mouth as needed for migraines. May repeat in 2 hrs if needed. PATIENT NEEDS OFFICE VISIT FOR  ADDITIONAL REFILLS 10 tablet 0   No current facility-administered medications on file prior to visit.    Review of Systems:  As per HPI- otherwise negative.   Physical Examination: Filed Vitals:   11/14/14 1627  BP: 122/76  Pulse: 66  Temp: 98 F (36.7 C)  Resp: 16   Filed Vitals:   11/14/14 1627  Height: 5' (1.524 m)  Weight: 123 lb 12.8 oz (56.155 kg)   Body mass index is 24.18 kg/(m^2). Ideal Body Weight: Weight in (lb) to have BMI = 25: 127.7  GEN: WDWN, NAD, Non-toxic, A & O x 3, looks well HEENT: Atraumatic, Normocephalic. Neck supple. No masses, No LAD. Ears and Nose: No external deformity. CV: RRR, No M/G/R. No JVD. No thrill. No extra heart sounds. PULM: CTA B, no wheezes, crackles, rhonchi. No retractions. No resp. distress. No accessory muscle use. EXTR: No c/c/e NEURO Normal gait.  PSYCH: Normally interactive. Conversant. Not depressed or anxious appearing.  Calm demeanor.   Results for orders placed or performed in visit on 11/14/14  POCT urine pregnancy  Result Value Ref Range   Preg Test, Ur Negative     Assessment and Plan: Nonintractable migraine, unspecified migraine type - Plan: rizatriptan (  MAXALT) 10 MG tablet  Absent menses - Plan: POCT urine pregnancy  Refilled her maxalt.  She is not pregnant- spacing menses likely due to perimenopause   Signed Lamar Blinks, MD

## 2014-12-01 ENCOUNTER — Other Ambulatory Visit: Payer: Self-pay

## 2014-12-07 ENCOUNTER — Other Ambulatory Visit: Payer: BLUE CROSS/BLUE SHIELD

## 2014-12-07 DIAGNOSIS — D696 Thrombocytopenia, unspecified: Secondary | ICD-10-CM

## 2014-12-07 LAB — CBC WITH DIFFERENTIAL/PLATELET
BASOS ABS: 0 10*3/uL (ref 0.0–0.1)
BASOS PCT: 0 % (ref 0–1)
EOS PCT: 1 % (ref 0–5)
Eosinophils Absolute: 0 10*3/uL (ref 0.0–0.7)
HEMATOCRIT: 42.1 % (ref 36.0–46.0)
Hemoglobin: 14 g/dL (ref 12.0–15.0)
LYMPHS ABS: 1.5 10*3/uL (ref 0.7–4.0)
Lymphocytes Relative: 36 % (ref 12–46)
MCH: 30.3 pg (ref 26.0–34.0)
MCHC: 33.3 g/dL (ref 30.0–36.0)
MCV: 91.1 fL (ref 78.0–100.0)
MONOS PCT: 6 % (ref 3–12)
MPV: 12.1 fL (ref 8.6–12.4)
Monocytes Absolute: 0.3 10*3/uL (ref 0.1–1.0)
NEUTROS ABS: 2.5 10*3/uL (ref 1.7–7.7)
Neutrophils Relative %: 57 % (ref 43–77)
Platelets: 147 10*3/uL — ABNORMAL LOW (ref 150–400)
RBC: 4.62 MIL/uL (ref 3.87–5.11)
RDW: 14.2 % (ref 11.5–15.5)
WBC: 4.3 10*3/uL (ref 4.0–10.5)

## 2015-05-15 ENCOUNTER — Other Ambulatory Visit: Payer: Self-pay

## 2015-05-15 DIAGNOSIS — Z1231 Encounter for screening mammogram for malignant neoplasm of breast: Secondary | ICD-10-CM

## 2015-06-21 ENCOUNTER — Ambulatory Visit (INDEPENDENT_AMBULATORY_CARE_PROVIDER_SITE_OTHER): Payer: BLUE CROSS/BLUE SHIELD | Admitting: Emergency Medicine

## 2015-06-21 ENCOUNTER — Ambulatory Visit (INDEPENDENT_AMBULATORY_CARE_PROVIDER_SITE_OTHER): Payer: BLUE CROSS/BLUE SHIELD

## 2015-06-21 VITALS — BP 120/70 | HR 68 | Temp 98.5°F | Resp 16 | Ht 60.0 in | Wt 122.0 lb

## 2015-06-21 DIAGNOSIS — R05 Cough: Secondary | ICD-10-CM | POA: Diagnosis not present

## 2015-06-21 DIAGNOSIS — J209 Acute bronchitis, unspecified: Secondary | ICD-10-CM | POA: Diagnosis not present

## 2015-06-21 DIAGNOSIS — R059 Cough, unspecified: Secondary | ICD-10-CM

## 2015-06-21 MED ORDER — AZITHROMYCIN 250 MG PO TABS: ORAL_TABLET | ORAL | Status: DC

## 2015-06-21 MED ORDER — HYDROCOD POLST-CPM POLST ER 10-8 MG/5ML PO SUER: 5.0000 mL | Freq: Two times a day (BID) | ORAL | Status: DC

## 2015-06-21 NOTE — Patient Instructions (Signed)

## 2015-06-21 NOTE — Progress Notes (Signed)
Subjective:  Patient ID: Beth Wilkerson, female    DOB: [birth year]  Age: 49 y.o. MRN: [id number]  CC: Cough   HPI Beth Wilkerson presents  with a cough. She's had a cough for over a month. Initially was productive of green sputum. It was associated with a green nasal drainage. She took over-the-counter cough syrup and that was associated with a reduction in her symptoms. She still has a sensation of cough in the mornings and frequently during the afternoons. She has no wheezing or shortness of breath. No fever chills. No nausea vomiting. She has no nausea vomiting stool change. No history of asthma. No history of allergies. No reflux esophagitis. Denies any other complaints  History Beth Wilkerson has a past medical history of Migraines.   She has past surgical history that includes Appendectomy.   Her  family history includes Cancer in her mother.  She   reports that she has never smoked. She has never used smokeless tobacco. She reports that she drinks alcohol. She reports that she does not use illicit drugs.  Outpatient Prescriptions Prior to Visit  Medication Sig Dispense Refill  . rizatriptan (MAXALT) 10 MG tablet Take 1 tab by mouth as needed for migraines. May repeat in 2 hrs if needed. PATIENT NEEDS OFFICE VISIT FOR ADDITIONAL REFILLS 10 tablet 6   No facility-administered medications prior to visit.    Social History   Social History  . Marital Status: Married    Spouse Name: N/A  . Number of Children: N/A  . Years of Education: N/A   Social History Main Topics  . Smoking status: Never Smoker   . Smokeless tobacco: Never Used  . Alcohol Use: 0.0 oz/week    0 Standard drinks or equivalent per week     Comment: Rare  . Drug Use: No  . Sexual Activity: Yes    Birth Control/ Protection: Condom     Comment: 1st intercourse 31 yo-1 partner   Other Topics Concern  . None   Social History Narrative     Review of Systems  Constitutional: Negative for fever, chills  and appetite change.  HENT: Negative for congestion, ear pain, postnasal drip, sinus pressure and sore throat.   Eyes: Negative for pain and redness.  Respiratory: Positive for cough. Negative for shortness of breath and wheezing.   Cardiovascular: Negative for leg swelling.  Gastrointestinal: Negative for nausea, vomiting, abdominal pain, diarrhea, constipation and blood in stool.  Endocrine: Negative for polyuria.  Genitourinary: Negative for dysuria, urgency, frequency and flank pain.  Musculoskeletal: Negative for gait problem.  Skin: Negative for rash.  Neurological: Negative for weakness and headaches.  Psychiatric/Behavioral: Negative for confusion and decreased concentration. The patient is not nervous/anxious.     Objective:  BP 120/70 mmHg  Pulse 68  Temp(Src) 98.5 F (36.9 C) (Oral)  Resp 16  Ht 5' (1.524 m)  Wt 122 lb (55.339 kg)  BMI 23.83 kg/m2  SpO2 99%  Physical Exam  Constitutional: She is oriented to person, place, and time. She appears well-developed and well-nourished.  HENT:  Head: Normocephalic and atraumatic.  Eyes: Conjunctivae are normal. Pupils are equal, round, and reactive to light.  Pulmonary/Chest: Effort normal.  Musculoskeletal: She exhibits no edema.  Neurological: She is alert and oriented to person, place, and time.  Skin: Skin is dry.  Psychiatric: She has a normal mood and affect. Her behavior is normal. Thought content normal.      Assessment & Plan:   Beth Wilkerson was seen today  for cough.  Diagnoses and all orders for this visit:  Cough -     DG Chest 2 View; Future  Acute bronchitis, unspecified organism  Other orders -     azithromycin (ZITHROMAX) 250 MG tablet; Take 2 tabs PO x 1 dose, then 1 tab PO QD x 4 days -     chlorpheniramine-HYDROcodone (TUSSIONEX PENNKINETIC ER) 10-8 MG/5ML SUER; Take 5 mLs by mouth 2 (two) times daily.  I am having Beth Wilkerson start on azithromycin and chlorpheniramine-HYDROcodone. I am also having  her maintain her rizatriptan.  Meds ordered this encounter  Medications  . azithromycin (ZITHROMAX) 250 MG tablet    Sig: Take 2 tabs PO x 1 dose, then 1 tab PO QD x 4 days    Dispense:  6 tablet    Refill:  0  . chlorpheniramine-HYDROcodone (TUSSIONEX PENNKINETIC ER) 10-8 MG/5ML SUER    Sig: Take 5 mLs by mouth 2 (two) times daily.    Dispense:  60 mL    Refill:  0    Appropriate red flag conditions were discussed with the patient as well as actions that should be taken.  Patient expressed his understanding.  Follow-up: Return if symptoms worsen or fail to improve.  Roselee Culver, MD   [facility] reading (PRIMARY) by  Dr. Ouida Sills.  negative.

## 2015-08-02 ENCOUNTER — Other Ambulatory Visit: Payer: Self-pay | Admitting: Family Medicine

## 2015-09-01 ENCOUNTER — Encounter: Payer: BLUE CROSS/BLUE SHIELD | Admitting: Women's Health

## 2015-09-19 ENCOUNTER — Ambulatory Visit
Admission: RE | Admit: 2015-09-19 | Discharge: 2015-09-19 | Disposition: A | Discharge status: Home / Self Care | Payer: BLUE CROSS/BLUE SHIELD | Source: Ambulatory Visit

## 2015-09-19 ENCOUNTER — Encounter: Payer: Self-pay | Admitting: Women's Health

## 2015-09-19 ENCOUNTER — Ambulatory Visit (INDEPENDENT_AMBULATORY_CARE_PROVIDER_SITE_OTHER): Payer: BLUE CROSS/BLUE SHIELD | Admitting: Women's Health

## 2015-09-19 VITALS — BP 132/78 | Ht 59.0 in | Wt 123.0 lb

## 2015-09-19 DIAGNOSIS — Z01419 Encounter for gynecological examination (general) (routine) without abnormal findings: Secondary | ICD-10-CM | POA: Diagnosis not present

## 2015-09-19 DIAGNOSIS — Z13220 Encounter for screening for lipoid disorders: Secondary | ICD-10-CM

## 2015-09-19 DIAGNOSIS — Z1231 Encounter for screening mammogram for malignant neoplasm of breast: Secondary | ICD-10-CM

## 2015-09-19 DIAGNOSIS — N951 Menopausal and female climacteric states: Secondary | ICD-10-CM | POA: Diagnosis not present

## 2015-09-19 LAB — COMPREHENSIVE METABOLIC PANEL
ALT: 12 U/L (ref 6–29)
AST: 17 U/L (ref 10–35)
Albumin: 4.2 g/dL (ref 3.6–5.1)
Alkaline Phosphatase: 73 U/L (ref 33–130)
BUN: 12 mg/dL (ref 7–25)
CHLORIDE: 102 mmol/L (ref 98–110)
CO2: 25 mmol/L (ref 20–31)
Calcium: 9.3 mg/dL (ref 8.6–10.4)
Creat: 0.72 mg/dL (ref 0.50–1.05)
Glucose, Bld: 86 mg/dL (ref 65–99)
POTASSIUM: 3.8 mmol/L (ref 3.5–5.3)
Sodium: 137 mmol/L (ref 135–146)
TOTAL PROTEIN: 7 g/dL (ref 6.1–8.1)
Total Bilirubin: 0.9 mg/dL (ref 0.2–1.2)

## 2015-09-19 LAB — CBC WITH DIFFERENTIAL/PLATELET
Basophils Absolute: 0 10*3/uL (ref 0.0–0.1)
Basophils Relative: 0 % (ref 0–1)
EOS ABS: 0 10*3/uL (ref 0.0–0.7)
EOS PCT: 1 % (ref 0–5)
HCT: 43.8 % (ref 36.0–46.0)
Hemoglobin: 14.8 g/dL (ref 12.0–15.0)
LYMPHS ABS: 1.5 10*3/uL (ref 0.7–4.0)
Lymphocytes Relative: 35 % (ref 12–46)
MCH: 31 pg (ref 26.0–34.0)
MCHC: 33.8 g/dL (ref 30.0–36.0)
MCV: 91.6 fL (ref 78.0–100.0)
MONO ABS: 0.3 10*3/uL (ref 0.1–1.0)
MONOS PCT: 6 % (ref 3–12)
MPV: 11.7 fL (ref 8.6–12.4)
Neutro Abs: 2.6 10*3/uL (ref 1.7–7.7)
Neutrophils Relative %: 58 % (ref 43–77)
PLATELETS: 151 10*3/uL (ref 150–400)
RBC: 4.78 MIL/uL (ref 3.87–5.11)
RDW: 13.7 % (ref 11.5–15.5)
WBC: 4.4 10*3/uL (ref 4.0–10.5)

## 2015-09-19 LAB — FOLLICLE STIMULATING HORMONE: FSH: 88 m[IU]/mL

## 2015-09-19 LAB — LIPID PANEL
CHOL/HDL RATIO: 3.3 ratio (ref <=5.0)
CHOLESTEROL: 249 mg/dL — AB (ref 125–200)
HDL: 76 mg/dL (ref >=46)
LDL Cholesterol: 153 mg/dL — ABNORMAL HIGH (ref <130)
Triglycerides: 98 mg/dL (ref <150)
VLDL: 20 mg/dL (ref <30)

## 2015-09-19 NOTE — Patient Instructions (Addendum)

## 2015-09-19 NOTE — Progress Notes (Signed)
Beth Wilkerson 20-Oct-1965 [id number]    History:    Presents for annual exam. Amenorrhea 1 year, had one day of pink spotting 2 months ago. Irregular cycles prior year. Normal Pap and mammogram history.   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  ROS:  A ROS was performed and pertinent positives and negatives are included.  Exam:  Filed Vitals:   09/19/15 0801  BP: 132/78    General appearance:  Normal Thyroid:  Symmetrical, normal in size, without palpable masses or nodularity. Respiratory  Auscultation:  Clear without wheezing or rhonchi Cardiovascular  Auscultation:  Regular rate, without rubs, murmurs or gallops  Edema/varicosities:  Not grossly evident Abdominal  Soft,nontender, without masses, guarding or rebound.  Liver/spleen:  No organomegaly noted  Hernia:  None appreciated  Skin  Inspection:  Grossly normal   Breasts: Examined lying and sitting.     Right: Without masses, retractions, discharge or axillary adenopathy.     Left: Without masses, retractions, discharge or axillary adenopathy. Gentitourinary   Inguinal/mons:  Normal without inguinal adenopathy  External genitalia:  Normal  BUS/Urethra/Skene's glands:  Normal  Vagina:  Normal  Cervix:  Normal  Uterus:   normal in size, shape and contour.  Midline and mobile  Adnexa/parametria:     Rt: Without masses or tenderness.   Lt: Without masses or tenderness.  Anus and perineum: Normal  Digital rectal exam: Normal sphincter tone without palpated masses or tenderness  Assessment/Plan:  50 y.o. MHF G1P1 for annual exam with no complaints.  Perimenopausal  Plan: SBE's, continue annual 3-D screening mammogram history of dense breasts, increase regular exercise, calcium rich diet, vitamin D 1000 daily encouraged. Instructed to call if any  further bleeding. CBC, CMP, lipid panel, FSH, vitamin D, UA, Pap normal with negative HR HPV 2014, new screening guidelines reviewed.    [place], 8:36 AM 09/19/2015

## 2015-09-20 ENCOUNTER — Ambulatory Visit (INDEPENDENT_AMBULATORY_CARE_PROVIDER_SITE_OTHER): Payer: BLUE CROSS/BLUE SHIELD | Admitting: Family Medicine

## 2015-09-20 ENCOUNTER — Encounter: Payer: Self-pay | Admitting: Internal Medicine

## 2015-09-20 ENCOUNTER — Other Ambulatory Visit: Payer: Self-pay | Admitting: Women's Health

## 2015-09-20 VITALS — BP 120/70 | HR 67 | Temp 98.7°F | Resp 16 | Ht 59.0 in | Wt 124.8 lb

## 2015-09-20 DIAGNOSIS — G43809 Other migraine, not intractable, without status migrainosus: Secondary | ICD-10-CM

## 2015-09-20 LAB — URINALYSIS W MICROSCOPIC + REFLEX CULTURE
BILIRUBIN URINE: NEGATIVE
Bacteria, UA: NONE SEEN [HPF]
Casts: NONE SEEN [LPF]
Crystals: NONE SEEN [HPF]
Glucose, UA: NEGATIVE
Hgb urine dipstick: NEGATIVE
KETONES UR: NEGATIVE
LEUKOCYTES UA: NEGATIVE
NITRITE: NEGATIVE
PH: 5.5 (ref 5.0–8.0)
Protein, ur: NEGATIVE
RBC / HPF: NONE SEEN RBC/HPF (ref <=2)
SPECIFIC GRAVITY, URINE: 1.02 (ref 1.001–1.035)
Squamous Epithelial / LPF: NONE SEEN [HPF] (ref <=5)
WBC UA: NONE SEEN WBC/HPF (ref <=5)
Yeast: NONE SEEN [HPF]

## 2015-09-20 LAB — VITAMIN D 25 HYDROXY (VIT D DEFICIENCY, FRACTURES): Vit D, 25-Hydroxy: 28 ng/mL — ABNORMAL LOW (ref 30–100)

## 2015-09-20 MED ORDER — RIZATRIPTAN BENZOATE 10 MG PO TABS: ORAL_TABLET | ORAL | Status: DC

## 2015-09-20 MED ORDER — VITAMIN D (ERGOCALCIFEROL) 1.25 MG (50000 UNIT) PO CAPS: 50000.0000 [IU] | ORAL_CAPSULE | ORAL | Status: DC

## 2015-09-20 NOTE — Patient Instructions (Signed)

## 2015-09-20 NOTE — Progress Notes (Signed)
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  She just needs a refill of Maxalt for the occasional migraine, fewer now   Objective:  BP 120/70 mmHg  Pulse 67  Temp(Src) 98.7 F (37.1 C) (Oral)  Resp 16  Ht 4\' 11"  (1.499 m)  Wt 124 lb 12.8 oz (56.609 kg)  BMI 25.19 kg/m2  SpO2 98% Discussed use of Maxalt  Other migraine without status migrainosus, not intractable - Plan: rizatriptan (MAXALT) 10 MG tablet  Robyn Haber, MD

## 2015-10-06 ENCOUNTER — Ambulatory Visit (AMBULATORY_SURGERY_CENTER): Payer: Self-pay

## 2015-10-06 VITALS — Ht 60.0 in | Wt 121.8 lb

## 2015-10-06 DIAGNOSIS — Z1211 Encounter for screening for malignant neoplasm of colon: Secondary | ICD-10-CM

## 2015-10-06 NOTE — Progress Notes (Signed)
No allergies to eggs or soy No home oxygen No diet/weight loss meds No past problems with anesthesia  Has email and internet; registered for emmi

## 2015-10-16 ENCOUNTER — Encounter: Payer: Self-pay | Admitting: Internal Medicine

## 2015-10-16 ENCOUNTER — Ambulatory Visit (AMBULATORY_SURGERY_CENTER): Payer: BLUE CROSS/BLUE SHIELD | Admitting: Internal Medicine

## 2015-10-16 VITALS — BP 120/60 | HR 60 | Temp 97.7°F | Resp 9 | Ht 60.0 in | Wt 121.0 lb

## 2015-10-16 DIAGNOSIS — Z1211 Encounter for screening for malignant neoplasm of colon: Secondary | ICD-10-CM

## 2015-10-16 MED ORDER — SODIUM CHLORIDE 0.9 % IV SOLN: 500.0000 mL | INTRAVENOUS | Status: DC

## 2015-10-16 NOTE — Progress Notes (Signed)
Report given to PACU RN, vss 

## 2015-10-16 NOTE — Op Note (Signed)
[facility] Patient Name: Beth Wilkerson Procedure Date: 10/16/2015 9:13 AM MRN: [id number] Endoscopist: Gatha Mayer , MD Age: 50 Referring MD:  Date of Birth: January 28, 1966 Gender: Female Procedure:                Colonoscopy Indications:              Screening for colorectal malignant neoplasm Medicines:                Propofol per Anesthesia, Monitored Anesthesia Care Procedure:                Pre-Anesthesia Assessment:                           - Prior to the procedure, a History and Physical                            was performed, and patient medications and                            allergies were reviewed. The patient's tolerance of                            previous anesthesia was also reviewed. The risks                            and benefits of the procedure and the sedation                            options and risks were discussed with the patient.                            All questions were answered, and informed consent                            was obtained. Prior Anticoagulants: The patient has                            taken no previous anticoagulant or antiplatelet                            agents. ASA Grade Assessment: I - A normal, healthy                            patient. After reviewing the risks and benefits,                            the patient was deemed in satisfactory condition to                            undergo the procedure.                           After obtaining informed consent, the colonoscope  was passed under direct vision. Throughout the                            procedure, the patient's blood pressure, pulse, and                            oxygen saturations were monitored continuously. The                            Model CF-HQ190L [id number]) scope was introduced                            through the anus and advanced to the the cecum,                            identified by appendiceal  orifice and ileocecal                            valve. The colonoscopy was performed without                            difficulty. The patient tolerated the procedure                            well. The quality of the bowel preparation was                            excellent. The bowel preparation used was Miralax.                            The ileocecal valve, appendiceal orifice, and                            rectum were photographed. Scope In: 9:24:50 AM Scope Out: 9:34:07 AM Scope Withdrawal Time: 0 hours 6 minutes 25 seconds  Total Procedure Duration: 0 hours 9 minutes 17 seconds  Findings:      An area of mild melanosis was found in the entire colon.      The exam was otherwise without abnormality.      Unable to retroflex. Complications:            No immediate complications. Estimated Blood Loss:     Estimated blood loss: none. Impression:               - Melanosis in the colon.                           - The examination was otherwise normal.                           - No specimens collected. Recommendation:           - Patient has a contact number available for                            emergencies. The signs and symptoms of potential  delayed complications were discussed with the                            patient. Return to normal activities tomorrow.                            Written discharge instructions were provided to the                            patient.                           - Resume previous diet.                           - Continue present medications.                           - Repeat colonoscopy in 10 years for screening                            purposes. Procedure Code(s):        --- Professional ---                           [id number], Colorectal cancer screening; colonoscopy on                            individual not meeting criteria for high risk CPT copyright 2016 [employer] Medical Association. All rights  reserved. Gatha Mayer, MD 10/16/2015 9:41:23 AM This report has been signed electronically. Number of Addenda: 0 Referring MD:

## 2015-10-16 NOTE — Patient Instructions (Addendum)
   No polyps or cancer seen.  I appreciate the opportunity to care for you. Gatha Mayer, MD, FACG  YOU HAD AN ENDOSCOPIC PROCEDURE TODAY AT [facility]:   Refer to the procedure report that was given to you for any specific questions about what was found during the examination.  If the procedure report does not answer your questions, please call your gastroenterologist to clarify.  If you requested that your care partner not be given the details of your procedure findings, then the procedure report has been included in a sealed envelope for you to review at your convenience later.  YOU SHOULD EXPECT: Some feelings of bloating in the abdomen. Passage of more gas than usual.  Walking can help get rid of the air that was put into your GI tract during the procedure and reduce the bloating. If you had a lower endoscopy (such as a colonoscopy or flexible sigmoidoscopy) you may notice spotting of blood in your stool or on the toilet paper. If you underwent a bowel prep for your procedure, you may not have a normal bowel movement for a few days.  Please Note:  You might notice some irritation and congestion in your nose or some drainage.  This is from the oxygen used during your procedure.  There is no need for concern and it should clear up in a day or so.  SYMPTOMS TO REPORT IMMEDIATELY:   Following lower endoscopy (colonoscopy or flexible sigmoidoscopy):  Excessive amounts of blood in the stool  Significant tenderness or worsening of abdominal pains  Swelling of the abdomen that is new, acute  Fever of 100F or higher  DIET: Your first meal following the procedure should be a small meal and then it is ok to progress to your normal diet. Heavy or fried foods are harder to digest and may make you feel nauseous or bloated.  Likewise, meals heavy in dairy and vegetables can increase bloating.  Drink plenty of fluids but you should avoid alcoholic beverages for 24  hours.  ACTIVITY:  You should plan to take it easy for the rest of today and you should NOT DRIVE or use heavy machinery until tomorrow (because of the sedation medicines used during the test).    FOLLOW UP: Our staff will call the number listed on your records the next business day following your procedure to check on you and address any questions or concerns that you may have regarding the information given to you following your procedure. If we do not reach you, we will leave a message.  However, if you are feeling well and you are not experiencing any problems, there is no need to return our call.  We will assume that you have returned to your regular daily activities without incident.  SIGNATURES/CONFIDENTIALITY: You and/or your care partner have signed paperwork which will be entered into your electronic medical record.  These signatures attest to the fact that that the information above on your After Visit Summary has been reviewed and is understood.  Full responsibility of the confidentiality of this discharge information lies with you and/or your care-partner.  Continue your normal medications  Next colonoscopy in 10 years

## 2015-10-17 ENCOUNTER — Telehealth: Payer: Self-pay | Admitting: *Deleted

## 2015-10-17 NOTE — Telephone Encounter (Signed)
Message left

## 2015-12-22 ENCOUNTER — Ambulatory Visit (INDEPENDENT_AMBULATORY_CARE_PROVIDER_SITE_OTHER): Payer: BLUE CROSS/BLUE SHIELD | Admitting: Women's Health

## 2015-12-22 ENCOUNTER — Encounter: Payer: Self-pay | Admitting: Women's Health

## 2015-12-22 DIAGNOSIS — N9489 Other specified conditions associated with female genital organs and menstrual cycle: Secondary | ICD-10-CM

## 2015-12-22 DIAGNOSIS — R300 Dysuria: Secondary | ICD-10-CM

## 2015-12-22 DIAGNOSIS — N952 Postmenopausal atrophic vaginitis: Secondary | ICD-10-CM | POA: Diagnosis not present

## 2015-12-22 DIAGNOSIS — N949 Unspecified condition associated with female genital organs and menstrual cycle: Secondary | ICD-10-CM | POA: Diagnosis not present

## 2015-12-22 LAB — URINALYSIS W MICROSCOPIC + REFLEX CULTURE
BACTERIA UA: NONE SEEN [HPF]
Bilirubin Urine: NEGATIVE
Casts: NONE SEEN [LPF]
Crystals: NONE SEEN [HPF]
Glucose, UA: NEGATIVE
HGB URINE DIPSTICK: NEGATIVE
Ketones, ur: NEGATIVE
LEUKOCYTES UA: NEGATIVE
Nitrite: NEGATIVE
PH: 7 (ref 5.0–8.0)
PROTEIN: NEGATIVE
RBC / HPF: NONE SEEN RBC/HPF (ref <=2)
Specific Gravity, Urine: 1.01 (ref 1.001–1.035)
WBC, UA: NONE SEEN WBC/HPF (ref <=5)
YEAST: NONE SEEN [HPF]

## 2015-12-22 LAB — WET PREP FOR TRICH, YEAST, CLUE
CLUE CELLS WET PREP: NONE SEEN
TRICH WET PREP: NONE SEEN
Yeast Wet Prep HPF POC: NONE SEEN

## 2015-12-22 MED ORDER — ESTRADIOL 2 MG VA RING: 2.0000 mg | VAGINAL_RING | VAGINAL | Status: DC

## 2015-12-22 NOTE — Progress Notes (Signed)
Patient ID: Beth Wilkerson, female   DOB: [birth year], 50 y.o.   MRN: [id number] Resents with complaint of vaginal and urinary burning. States the burning sensation with urination is intermittent for the past month. Denies vaginal discharge with itching or odor but a burning sensation especially externally. Vaginal dryness with intercourse using Coconut oil with some relief. Denies abdominal pain, fever or pain at end of stream of urination. Postmenopausal one year with elevated FSH. Had a recent negative colonoscopy.  Exam: Appears well. No CVAT. Abdomen soft nontender, external genitalia minimal erythema, speculum exam vaginal atrophy, no visible discharge or erythema, wet prep negative. UA: Negative  Vaginal atrophy causing burning sensation  Plan: Options reviewed will try Estring 2 mg per vagina every 3 months prescription, proper use, given and reviewed minimal systemic absorption. Instructed to call if no relief of burning sensation. Continue lubricants with intercourse.

## 2016-04-05 ENCOUNTER — Other Ambulatory Visit: Payer: Self-pay | Admitting: Women's Health

## 2016-04-05 DIAGNOSIS — Z1231 Encounter for screening mammogram for malignant neoplasm of breast: Secondary | ICD-10-CM

## 2016-05-16 ENCOUNTER — Ambulatory Visit (INDEPENDENT_AMBULATORY_CARE_PROVIDER_SITE_OTHER): Payer: BLUE CROSS/BLUE SHIELD | Admitting: Physician Assistant

## 2016-05-16 VITALS — BP 122/70 | HR 83 | Temp 98.3°F | Resp 18 | Ht 60.0 in | Wt 128.6 lb

## 2016-05-16 DIAGNOSIS — S39012A Strain of muscle, fascia and tendon of lower back, initial encounter: Secondary | ICD-10-CM | POA: Diagnosis not present

## 2016-05-16 MED ORDER — CYCLOBENZAPRINE HCL 5 MG PO TABS: 5.0000 mg | ORAL_TABLET | Freq: Three times a day (TID) | ORAL | 1 refills | Status: DC | PRN

## 2016-05-16 MED ORDER — MELOXICAM 7.5 MG PO TABS: 7.5000 mg | ORAL_TABLET | Freq: Every day | ORAL | 0 refills | Status: DC

## 2016-05-16 NOTE — Patient Instructions (Addendum)
This appears to be a muscle strain. I would like you to ice the lower back 3 times a day for 15-25 minutes. You can do this directly after a stretch. You can choose 3 pictures and do them. Because of the Flexeril as this may call sedation. Try 1 tablet at first. He did take 2 tablets at night or if you're not operating any heavy machinery.    Low Back Sprain With Rehab A sprain is an injury in which a ligament is torn. The ligaments of the lower back are vulnerable to sprains. However, they are strong and require great force to be injured. These ligaments are important for stabilizing the spinal column. Sprains are classified into three categories. Grade 1 sprains cause pain, but the tendon is not lengthened. Grade 2 sprains include a lengthened ligament, due to the ligament being stretched or partially ruptured. With grade 2 sprains there is still function, although the function may be decreased. Grade 3 sprains involve a complete tear of the tendon or muscle, and function is usually impaired. SYMPTOMS   Severe pain in the lower back.  Sometimes, a feeling of a "pop," "snap," or tear, at the time of injury.  Tenderness and sometimes swelling at the injury site.  Uncommonly, bruising (contusion) within 48 hours of injury.  Muscle spasms in the back. CAUSES  Low back sprains occur when a force is placed on the ligaments that is greater than they can handle. Common causes of injury include:  Performing a stressful act while off-balance.  Repetitive stressful activities that involve movement of the lower back.  Direct hit (trauma) to the lower back. RISK INCREASES WITH:  Contact sports (football, wrestling).  Collisions (major skiing accidents).  Sports that require throwing or lifting (baseball, weightlifting).  Sports involving twisting of the spine (gymnastics, diving, tennis, golf).  Poor strength and flexibility.  Inadequate protection.  Previous back injury or surgery  (especially fusion). PREVENTION  Wear properly fitted and padded protective equipment.  Warm up and stretch properly before activity.  Allow for adequate recovery between workouts.  Maintain physical fitness:  Strength, flexibility, and endurance.  Cardiovascular fitness.  Maintain a healthy body weight. PROGNOSIS  If treated properly, low back sprains usually heal with non-surgical treatment. The length of time for healing depends on the severity of the injury.  RELATED COMPLICATIONS   Recurring symptoms, resulting in a chronic problem.  Chronic inflammation and pain in the low back.  Delayed healing or resolution of symptoms, especially if activity is resumed too soon.  Prolonged impairment.  Unstable or arthritic joints of the low back. TREATMENT  Treatment first involves the use of ice and medicine, to reduce pain and inflammation. The use of strengthening and stretching exercises may help reduce pain with activity. These exercises may be performed at home or with a therapist. Severe injuries may require referral to a therapist for further evaluation and treatment, such as ultrasound. Your caregiver may advise that you wear a back brace or corset, to help reduce pain and discomfort. Often, prolonged bed rest results in greater harm then benefit. Corticosteroid injections may be recommended. However, these should be reserved for the most serious cases. It is important to avoid using your back when lifting objects. At night, sleep on your back on a firm mattress, with a pillow placed under your knees. If non-surgical treatment is unsuccessful, surgery may be needed.  MEDICATION   If pain medicine is needed, nonsteroidal anti-inflammatory medicines (aspirin and ibuprofen), or other minor  pain relievers (acetaminophen), are often advised.  Do not take pain medicine for 7 days before surgery.  Prescription pain relievers may be given, if your caregiver thinks they are needed. Use  only as directed and only as much as you need.  Ointments applied to the skin may be helpful.  Corticosteroid injections may be given by your caregiver. These injections should be reserved for the most serious cases, because they may only be given a certain number of times. HEAT AND COLD  Cold treatment (icing) should be applied for 10 to 15 minutes every 2 to 3 hours for inflammation and pain, and immediately after activity that aggravates your symptoms. Use ice packs or an ice massage.  Heat treatment may be used before performing stretching and strengthening activities prescribed by your caregiver, physical therapist, or athletic trainer. Use a heat pack or a warm water soak. SEEK MEDICAL CARE IF:   Symptoms get worse or do not improve in 2 to 4 weeks, despite treatment.  You develop numbness or weakness in either leg.  You lose bowel or bladder function.  Any of the following occur after surgery: fever, increased pain, swelling, redness, drainage of fluids, or bleeding in the affected area.  New, unexplained symptoms develop. (Drugs used in treatment may produce side effects.) EXERCISES  RANGE OF MOTION (ROM) AND STRETCHING EXERCISES - Low Back Sprain Most people with lower back pain will find that their symptoms get worse with excessive bending forward (flexion) or arching at the lower back (extension). The exercises that will help resolve your symptoms will focus on the opposite motion.  Your physician, physical therapist or athletic trainer will help you determine which exercises will be most helpful to resolve your lower back pain. Do not complete any exercises without first consulting with your caregiver. Discontinue any exercises which make your symptoms worse, until you speak to your caregiver. If you have pain, numbness or tingling which travels down into your buttocks, leg or foot, the goal of the therapy is for these symptoms to move closer to your back and eventually resolve.  Sometimes, these leg symptoms will get better, but your lower back pain may worsen. This is often an indication of progress in your rehabilitation. Be very alert to any changes in your symptoms and the activities in which you participated in the 24 hours prior to the change. Sharing this information with your caregiver will allow him or her to most efficiently treat your condition. These exercises may help you when beginning to rehabilitate your injury. Your symptoms may resolve with or without further involvement from your physician, physical therapist or athletic trainer. While completing these exercises, remember:   Restoring tissue flexibility helps normal motion to return to the joints. This allows healthier, less painful movement and activity.  An effective stretch should be held for at least 30 seconds.  A stretch should never be painful. You should only feel a gentle lengthening or release in the stretched tissue. FLEXION RANGE OF MOTION AND STRETCHING EXERCISES: STRETCH - Flexion, Single Knee to Chest   Lie on a firm bed or floor with both legs extended in front of you.  Keeping one leg in contact with the floor, bring your opposite knee to your chest. Hold your leg in place by either grabbing behind your thigh or at your knee.  Pull until you feel a gentle stretch in your low back. Hold __10________ seconds.  Slowly release your grasp and repeat the exercise with the opposite side. Repeat  _____5_____ times. Complete this exercise ___3_______ times per day.  STRETCH - Flexion, Double Knee to Chest  Lie on a firm bed or floor with both legs extended in front of you.  Keeping one leg in contact with the floor, bring your opposite knee to your chest.  Tense your stomach muscles to support your back and then lift your other knee to your chest. Hold your legs in place by either grabbing behind your thighs or at your knees.  Pull both knees toward your chest until you feel a gentle  stretch in your low back. Hold _____10_____ seconds.  Tense your stomach muscles and slowly return one leg at a time to the floor. Repeat ____5______ times. Complete this exercise _____3_____ times per day.  STRETCH - Low Trunk Rotation  Lie on a firm bed or floor. Keeping your legs in front of you, bend your knees so they are both pointed toward the ceiling and your feet are flat on the floor.  Extend your arms out to the side. This will stabilize your upper body by keeping your shoulders in contact with the floor.  Gently and slowly drop both knees together to one side until you feel a gentle stretch in your low back. Hold for _____10_____ seconds.  Tense your stomach muscles to support your lower back as you bring your knees back to the starting position. Repeat the exercise to the other side. Repeat _____5_____ times. Complete this exercise _____3_____ times per day  EXTENSION RANGE OF MOTION AND FLEXIBILITY EXERCISES: STRETCH - Extension, Prone on Elbows   Lie on your stomach on the floor, a bed will be too soft. Place your palms about shoulder width apart and at the height of your head.  Place your elbows under your shoulders. If this is too painful, stack pillows under your chest.  Allow your body to relax so that your hips drop lower and make contact more completely with the floor.  Hold this position for ______10____ seconds.  Slowly return to lying flat on the floor. Repeat ____5______ times. Complete this exercise ____3______ times per day.  RANGE OF MOTION - Extension, Prone Press Ups  Lie on your stomach on the floor, a bed will be too soft. Place your palms about shoulder width apart and at the height of your head.  Keeping your back as relaxed as possible, slowly straighten your elbows while keeping your hips on the floor. You may adjust the placement of your hands to maximize your comfort. As you gain motion, your hands will come more underneath your shoulders.  Hold  this position ______10____ seconds.  Slowly return to lying flat on the floor. Repeat ___5_______ times. Complete this exercise ___3_______ times per day.  RANGE OF MOTION- Quadruped, Neutral Spine   Assume a hands and knees position on a firm surface. Keep your hands under your shoulders and your knees under your hips. You may place padding under your knees for comfort.  Drop your head and point your tailbone toward the ground below you. This will round out your lower back like an angry cat. Hold this position for ____10______ seconds.  Slowly lift your head and release your tail bone so that your back sags into a large arch, like an old horse.  Hold this position for ___10____ seconds.  Repeat this until you feel limber in your low back.  Now, find your "sweet spot." This will be the most comfortable position somewhere between the two previous positions. This is your neutral  spine. Once you have found this position, tense your stomach muscles to support your low back.  Hold this position for ___10_______ seconds. Repeat ____5______ times. Complete this exercise ____3______ times per day.  STRENGTHENING EXERCISES - Low Back Sprain These exercises may help you when beginning to rehabilitate your injury. These exercises should be done near your "sweet spot." This is the neutral, low-back arch, somewhere between fully rounded and fully arched, that is your least painful position. When performed in this safe range of motion, these exercises can be used for people who have either a flexion or extension based injury. These exercises may resolve your symptoms with or without further involvement from your physician, physical therapist or athletic trainer. While completing these exercises, remember:   Muscles can gain both the endurance and the strength needed for everyday activities through controlled exercises.  Complete these exercises as instructed by your physician, physical therapist or  athletic trainer. Increase the resistance and repetitions only as guided.  You may experience muscle soreness or fatigue, but the pain or discomfort you are trying to eliminate should never worsen during these exercises. If this pain does worsen, stop and make certain you are following the directions exactly. If the pain is still present after adjustments, discontinue the exercise until you can discuss the trouble with your caregiver. STRENGTHENING - Deep Abdominals, Pelvic Tilt   Lie on a firm bed or floor. Keeping your legs in front of you, bend your knees so they are both pointed toward the ceiling and your feet are flat on the floor.  Tense your lower abdominal muscles to press your low back into the floor. This motion will rotate your pelvis so that your tail bone is scooping upwards rather than pointing at your feet or into the floor. With a gentle tension and even breathing, hold this position for ______10____ seconds. Repeat _____5_____ times. Complete this exercise _____3_____ times per day.  STRENGTHENING - Abdominals, Crunches   Lie on a firm bed or floor. Keeping your legs in front of you, bend your knees so they are both pointed toward the ceiling and your feet are flat on the floor. Cross your arms over your chest.  Slightly tip your chin down without bending your neck.  Tense your abdominals and slowly lift your trunk high enough to just clear your shoulder blades. Lifting higher can put excessive stress on the lower back and does not further strengthen your abdominal muscles.  Control your return to the starting position. Repeat ____5______ times. Complete this exercise ___3_______ times per day.  STRENGTHENING - Quadruped, Opposite UE/LE Lift   Assume a hands and knees position on a firm surface. Keep your hands under your shoulders and your knees under your hips. You may place padding under your knees for comfort.  Find your neutral spine and gently tense your abdominal  muscles so that you can maintain this position. Your shoulders and hips should form a rectangle that is parallel with the floor and is not twisted.  Keeping your trunk steady, lift your right hand no higher than your shoulder and then your left leg no higher than your hip. Make sure you are not holding your breath. Hold this position for ____10 ______ seconds.  Continuing to keep your abdominal muscles tense and your back steady, slowly return to your starting position. Repeat with the opposite arm and leg. Repeat ______5____ times. Complete this exercise _____3_____ times per day.  STRENGTHENING - Abdominals and Quadriceps, Straight Leg Raise  Lie on a firm bed or floor with both legs extended in front of you.  Keeping one leg in contact with the floor, bend the other knee so that your foot can rest flat on the floor.  Find your neutral spine, and tense your abdominal muscles to maintain your spinal position throughout the exercise.  Slowly lift your straight leg off the floor about 6 inches for a count of 15, making sure to not hold your breath.  Still keeping your neutral spine, slowly lower your leg all the way to the floor. Repeat this exercise with each leg ___5_______ times. Complete this exercise _______3___ times per day. POSTURE AND BODY MECHANICS CONSIDERATIONS - Low Back Sprain Keeping correct posture when sitting, standing or completing your activities will reduce the stress put on different body tissues, allowing injured tissues a chance to heal and limiting painful experiences. The following are general guidelines for improved posture. Your physician or physical therapist will provide you with any instructions specific to your needs. While reading these guidelines, remember:  The exercises prescribed by your provider will help you have the flexibility and strength to maintain correct postures.  The correct posture provides the best environment for your joints to work. All of  your joints have less wear and tear when properly supported by a spine with good posture. This means you will experience a healthier, less painful body.  Correct posture must be practiced with all of your activities, especially prolonged sitting and standing. Correct posture is as important when doing repetitive low-stress activities (typing) as it is when doing a single heavy-load activity (lifting). RESTING POSITIONS Consider which positions are most painful for you when choosing a resting position. If you have pain with flexion-based activities (sitting, bending, stooping, squatting), choose a position that allows you to rest in a less flexed posture. You would want to avoid curling into a fetal position on your side. If your pain worsens with extension-based activities (prolonged standing, working overhead), avoid resting in an extended position such as sleeping on your stomach. Most people will find more comfort when they rest with their spine in a more neutral position, neither too rounded nor too arched. Lying on a non-sagging bed on your side with a pillow between your knees, or on your back with a pillow under your knees will often provide some relief. Keep in mind, being in any one position for a prolonged period of time, no matter how correct your posture, can still lead to stiffness. PROPER SITTING POSTURE In order to minimize stress and discomfort on your spine, you must sit with correct posture. Sitting with good posture should be effortless for a healthy body. Returning to good posture is a gradual process. Many people can work toward this most comfortably by using various supports until they have the flexibility and strength to maintain this posture on their own. When sitting with proper posture, your ears will fall over your shoulders and your shoulders will fall over your hips. You should use the back of the chair to support your upper back. Your lower back will be in a neutral position,  just slightly arched. You may place a small pillow or folded towel at the base of your lower back for  support.  When working at a desk, create an environment that supports good, upright posture. Without extra support, muscles tire, which leads to excessive strain on joints and other tissues. Keep these recommendations in mind: CHAIR:  A chair should be able to slide  under your desk when your back makes contact with the back of the chair. This allows you to work closely.  The chair's height should allow your eyes to be level with the upper part of your monitor and your hands to be slightly lower than your elbows. BODY POSITION  Your feet should make contact with the floor. If this is not possible, use a foot rest.  Keep your ears over your shoulders. This will reduce stress on your neck and low back. INCORRECT SITTING POSTURES  If you are feeling tired and unable to assume a healthy sitting posture, do not slouch or slump. This puts excessive strain on your back tissues, causing more damage and pain. Healthier options include:  Using more support, like a lumbar pillow.  Switching tasks to something that requires you to be upright or walking.  Talking a brief walk.  Lying down to rest in a neutral-spine position. PROLONGED STANDING WHILE SLIGHTLY LEANING FORWARD  When completing a task that requires you to lean forward while standing in one place for a long time, place either foot up on a stationary 2-4 inch high object to help maintain the best posture. When both feet are on the ground, the lower back tends to lose its slight inward curve. If this curve flattens (or becomes too large), then the back and your other joints will experience too much stress, tire more quickly, and can cause pain. CORRECT STANDING POSTURES Proper standing posture should be assumed with all daily activities, even if they only take a few moments, like when brushing your teeth. As in sitting, your ears should fall  over your shoulders and your shoulders should fall over your hips. You should keep a slight tension in your abdominal muscles to brace your spine. Your tailbone should point down to the ground, not behind your body, resulting in an over-extended swayback posture.  INCORRECT STANDING POSTURES  Common incorrect standing postures include a forward head, locked knees and/or an excessive swayback. WALKING Walk with an upright posture. Your ears, shoulders and hips should all line-up. PROLONGED ACTIVITY IN A FLEXED POSITION When completing a task that requires you to bend forward at your waist or lean over a low surface, try to find a way to stabilize 3 out of 4 of your limbs. You can place a hand or elbow on your thigh or rest a knee on the surface you are reaching across. This will provide you more stability, so that your muscles do not tire as quickly. By keeping your knees relaxed, or slightly bent, you will also reduce stress across your lower back. CORRECT LIFTING TECHNIQUES DO :  Assume a wide stance. This will provide you more stability and the opportunity to get as close as possible to the object which you are lifting.  Tense your abdominals to brace your spine. Bend at the knees and hips. Keeping your back locked in a neutral-spine position, lift using your leg muscles. Lift with your legs, keeping your back straight.  Test the weight of unknown objects before attempting to lift them.  Try to keep your elbows locked down at your sides in order get the best strength from your shoulders when carrying an object.  Always ask for help when lifting heavy or awkward objects. INCORRECT LIFTING TECHNIQUES DO NOT:   Lock your knees when lifting, even if it is a small object.  Bend and twist. Pivot at your feet or move your feet when needing to change directions.  Assume that  you can safely pick up even a paperclip without proper posture.   This information is not intended to replace advice  given to you by your health care provider. Make sure you discuss any questions you have with your health care provider.   Document Released: 07/01/2005 Document Revised: 07/22/2014 Document Reviewed: 10/13/2008 Elsevier Interactive Patient Education 2016 [employer].    IF you received an x-ray today, you will receive an invoice from [facility]. Please contact [facility] at [phone] with questions or concerns regarding your invoice.   IF you received labwork today, you will receive an invoice from [employer]. Please contact [facility] at [phone] with questions or concerns regarding your invoice.   Our billing staff will not be able to assist you with questions regarding bills from these companies.  You will be contacted with the lab results as soon as they are available. The fastest way to get your results is to activate your My Chart account. Instructions are located on the last page of this paperwork. If you have not heard from [place] regarding the results in 2 weeks, please contact this office.

## 2016-05-16 NOTE — Progress Notes (Signed)
[facility] [address] [place] [address] [phone]  Date:  05/16/2016   Name:  Beth Wilkerson   DOB:  October 29, 1965   MRN:  [id number]  PCP:  Huel Cote, NP    History of Present Illness:  Beth Wilkerson is a 50 y.o. female patient who presents to [facility] for chief complaint low back pain. One week ago, patient was carrying a 32 case of bottled water. As she bent down to place it in her car she felt a sharp pain in her low back. Low back pain was prominent on the right side. Over the next few days it radiated across the lower back. It also radiates down the lateral thigh. She denies any numbness or tingling. She has attempted Biofreeze, and analgesic pads which did not help. She attempted to use her migraine medication, Maxalt, which helped a little bit with rest but not much.     Patient Active Problem List   Diagnosis Date Noted  . Hemorrhoids 08/26/2013    Past Medical History:  Diagnosis Date  . Migraines     Past Surgical History:  Procedure Laterality Date  . APPENDECTOMY      Social History  Substance Use Topics  . Smoking status: Never Smoker  . Smokeless tobacco: Never Used  . Alcohol use 0.0 oz/week     Comment: Rare    Family History  Problem Relation Age of Onset  . Cancer Mother     bladder  . Colon cancer Neg Hx     No Known Allergies  Medication list has been reviewed and updated.  Current Outpatient Prescriptions on File Prior to Visit  Medication Sig Dispense Refill  . rizatriptan (MAXALT) 10 MG tablet TAKE 1 TABLET BY MOUTH AS NEEDED FOR MIGRAINES. MAY REPEAT IN 2 HOURS IF NEEDED. 10 tablet 11  . Vitamin D, Ergocalciferol, (DRISDOL) 50000 units CAPS capsule Take 1 capsule (50,000 Units total) by mouth every 7 (seven) days. 12 capsule 0  . estradiol (ESTRING) 2 MG vaginal ring Place 2 mg vaginally every 3 (three) months. follow package directions (Patient not taking: Reported on 05/16/2016) 1 each 4   No current  facility-administered medications on file prior to visit.     ROS ROS otherwise unremarkable unless listed above.  Physical Examination: BP 122/70   Pulse 83   Temp 98.3 F (36.8 C) (Oral)   Resp 18   Ht 5' (1.524 m)   Wt 128 lb 9.6 oz (58.3 kg)   SpO2 96%   BMI 25.12 kg/m  Ideal Body Weight: Weight in (lb) to have BMI = 25: 127.7  Physical Exam  Constitutional: She is oriented to person, place, and time. She appears well-developed and well-nourished. No distress.  HENT:  Head: Normocephalic and atraumatic.  Right Ear: External ear normal.  Left Ear: External ear normal.  Eyes: Conjunctivae and EOM are normal. Pupils are equal, round, and reactive to light.  Cardiovascular: Normal rate.   Pulmonary/Chest: Effort normal. No respiratory distress.  Musculoskeletal:  Paraspinal tenderness of the lower lumbar without swelling or spasm detected. There is no ecchymosis. No bony tenderness present. Positive straight leg raise test bilaterally. There is pain and sided at the lower back with internal rotation of both hips. Normal reflexes. Forward flexion is a bout 45 with pain and sided at the lower back. Normal lateral deviation of both sides. Torso rotation has point tenderness along the right lower side with right sided movement.  Neurological: She  is alert and oriented to person, place, and time.  Skin: She is not diaphoretic.  Psychiatric: She has a normal mood and affect. Her behavior is normal.     Assessment and Plan: Beth Wilkerson is a 50 y.o. female who is here today for chief complaint of low back pain. This appears to be a low lumbar back strain likely secondary to deconditioning. I've advised ice 3 times a day. Given an anti-inflammatory discussed precautions of other NSAIDs. Also given oral muscle relaxant. Precautions again expressed of sedated properties. She was given stretches verbally and in handout. She will return in 7-10 days if her symptoms do not improve. Low  back strain, initial encounter - Plan: meloxicam (MOBIC) 7.5 MG tablet, cyclobenzaprine (FLEXERIL) 5 MG tablet  Beth Drape, Beth Wilkerson [facility] 11/2/201710:11 AM

## 2016-05-23 ENCOUNTER — Ambulatory Visit (INDEPENDENT_AMBULATORY_CARE_PROVIDER_SITE_OTHER): Payer: BLUE CROSS/BLUE SHIELD | Admitting: Physician Assistant

## 2016-05-23 VITALS — BP 118/80 | HR 87 | Temp 98.0°F | Resp 18 | Ht 60.0 in | Wt 128.8 lb

## 2016-05-23 DIAGNOSIS — S39012S Strain of muscle, fascia and tendon of lower back, sequela: Secondary | ICD-10-CM | POA: Diagnosis not present

## 2016-05-23 NOTE — Patient Instructions (Addendum)
Continue taking the mobic until you run out and then use the flexeril as needed for spasms in the future. Use heat to the affected area and begin stretching daily to strengthen your muscles. Thank you for letting me participate in your health and well being! It was a pleasure meeting you.   For stretching exercises use this website below:  [email or web address]-[email or web address]   IF you received an x-ray today, you will receive an invoice from [facility]. Please contact [facility] at [phone] with questions or concerns regarding your invoice.   IF you received labwork today, you will receive an invoice from [employer]. Please contact [facility] at [phone] with questions or concerns regarding your invoice.   Our billing staff will not be able to assist you with questions regarding bills from these companies.  You will be contacted with the lab results as soon as they are available. The fastest way to get your results is to activate your My Chart account. Instructions are located on the last page of this paperwork. If you have not heard from [place] regarding the results in 2 weeks, please contact this office.

## 2016-05-23 NOTE — Progress Notes (Signed)
    MRN: [id number] DOB: 1966/05/08  Subjective:   Beth Wilkerson is a 50 y.o. female presenting for follow up on low back strain. She has taking the mobic and flexeril as prescribed. Notes that by the third day of treatment she felt much better. She has continued the mobic and flexeril and has been doing the back stretches prescribed. Notes that all was going well but she did have sexual intercourse with her husband last night and it made the initial affected area a little sore this morning. She is wondering if we have any information on stretching for the entire body. Other than that, she has no complaints or concerns.   Beth Wilkerson has a current medication list which includes the following prescription(s): rizatriptan and vitamin d (ergocalciferol). Also has No Known Allergies.  Beth Wilkerson  has a past medical history of Migraines. Also  has a past surgical history that includes Appendectomy.   Objective:   Vitals: BP 118/80   Pulse 87   Temp 98 F (36.7 C) (Oral)   Resp 18   Ht 5' (1.524 m)   Wt 128 lb 12.8 oz (58.4 kg)   SpO2 95%   BMI 25.15 kg/m   Physical Exam  Constitutional: She is oriented to person, place, and time. She appears well-developed and well-nourished.  HENT:  Head: Normocephalic and atraumatic.  Eyes: Conjunctivae are normal.  Neck: Normal range of motion.  Pulmonary/Chest: Effort normal.  Musculoskeletal:       Cervical back: She exhibits tenderness (along right sided musculature). She exhibits normal range of motion and no bony tenderness.       Thoracic back: Normal.       Lumbar back: She exhibits tenderness (minimal tenderness palpated along right side musculature). She exhibits normal range of motion and no bony tenderness.  Neurological: She is alert and oriented to person, place, and time.  Skin: Skin is warm and dry.  Psychiatric: She has a normal mood and affect.  Vitals reviewed.   No results found for this or any previous visit (from the past 24  hour(s)).  Assessment and Plan :  1. Low back strain, sequela -Pt instructed to continue meloxicam and flexeril until pain has fully subsided then she can use it as needed in the future. Use heat to affected area at this point in the healing process. Also given website link for information and guidance for full body stretching.  -Return to clinic if symptoms worsen, do not improve, or as needed  Tenna Delaine, PA-C  [facility] 05/23/2016 8:30 AM

## 2016-07-10 ENCOUNTER — Ambulatory Visit (INDEPENDENT_AMBULATORY_CARE_PROVIDER_SITE_OTHER): Payer: BLUE CROSS/BLUE SHIELD | Admitting: Women's Health

## 2016-07-10 ENCOUNTER — Encounter: Payer: Self-pay | Admitting: Women's Health

## 2016-07-10 VITALS — BP 122/80

## 2016-07-10 DIAGNOSIS — N950 Postmenopausal bleeding: Secondary | ICD-10-CM

## 2016-07-10 DIAGNOSIS — N898 Other specified noninflammatory disorders of vagina: Secondary | ICD-10-CM | POA: Diagnosis not present

## 2016-07-10 LAB — WET PREP FOR TRICH, YEAST, CLUE
Clue Cells Wet Prep HPF POC: NONE SEEN
Trich, Wet Prep: NONE SEEN
WBC, Wet Prep HPF POC: NONE SEEN
Yeast Wet Prep HPF POC: NONE SEEN

## 2016-07-10 NOTE — Progress Notes (Signed)
Presents with complaint of occasional brown, mucous discharge for 1 week.  Postmenopausal, last cycle almost 2 years ago, not on HRT.  Sexually active, same partner.  Denies abdominal pain, cramping, urinary symptoms, vaginal itching or odor.  Exam:  External genitalia within normal limits.  Speculum exam: Vaginal mucosa within normal limits. Small amount of brown discharge in vagina.  Small amount red blood from cervix.  Bimanual exam, no CMT, no adnexal tenderness.  Wet prep negative.   Postmenopausal bleeding   Plan: Schedule sonohysterogram with Dr.  Phineas Real.

## 2016-07-10 NOTE — Patient Instructions (Signed)
Postmenopausal Bleeding Postmenopausal bleeding is any bleeding after menopause. Menopause is when a woman's period stops. Any type of bleeding after menopause is concerning. It should be checked by your doctor. Any treatment will depend on the cause. Follow these instructions at home: Watch your condition for any changes.  Avoid the use of tampons and douches as told by your doctor.  Change your pads often.  Get regular pelvic exams and Pap tests.  Keep all appointments for tests as told by your doctor.  Contact a doctor if:  Your bleeding lasts for more than 1 week.  You have belly (abdominal) pain.  You have bleeding after sex (intercourse). Get help right away if:  You have a fever, chills, a headache, dizziness, muscle aches, and bleeding.  You have strong pain with bleeding.  You have clumps of blood (blood clots) coming from your vagina.  You have bleeding and need more than 1 pad an hour.  You feel like you are going to pass out (faint). This information is not intended to replace advice given to you by your health care provider. Make sure you discuss any questions you have with your health care provider. Document Released: 04/09/2008 Document Revised: 12/07/2015 Document Reviewed: 01/28/2013 Elsevier Interactive Patient Education  2017 Elsevier Inc.  

## 2016-07-16 ENCOUNTER — Other Ambulatory Visit: Payer: Self-pay | Admitting: Gynecology

## 2016-07-16 DIAGNOSIS — N950 Postmenopausal bleeding: Secondary | ICD-10-CM

## 2016-07-24 ENCOUNTER — Ambulatory Visit (INDEPENDENT_AMBULATORY_CARE_PROVIDER_SITE_OTHER): Payer: BLUE CROSS/BLUE SHIELD

## 2016-07-24 ENCOUNTER — Other Ambulatory Visit: Payer: Self-pay | Admitting: Gynecology

## 2016-07-24 ENCOUNTER — Encounter: Payer: Self-pay | Admitting: Gynecology

## 2016-07-24 ENCOUNTER — Ambulatory Visit (INDEPENDENT_AMBULATORY_CARE_PROVIDER_SITE_OTHER): Payer: BLUE CROSS/BLUE SHIELD | Admitting: Gynecology

## 2016-07-24 VITALS — BP 118/76

## 2016-07-24 DIAGNOSIS — N950 Postmenopausal bleeding: Secondary | ICD-10-CM | POA: Diagnosis not present

## 2016-07-24 DIAGNOSIS — N83201 Unspecified ovarian cyst, right side: Secondary | ICD-10-CM

## 2016-07-24 DIAGNOSIS — D251 Intramural leiomyoma of uterus: Secondary | ICD-10-CM | POA: Diagnosis not present

## 2016-07-24 NOTE — Patient Instructions (Signed)
Office will call you with biopsy results 

## 2016-07-24 NOTE — Progress Notes (Signed)
    Beth Wilkerson [birth year] [id number]        50 y.o.  G1P1 presents for sonohysterogram.  Had 2 days of darkish discharge. Last menstrual period 2 years ago. No cramping or pain. No moliminal symptoms or premenstrual type symptoms such as cramping or breast tenderness before this. No bleeding afterwards.  Past medical history,surgical history, problem list, medications, allergies, family history and social history were all reviewed and documented in the EPIC chart.  Directed ROS with pertinent positives and negatives documented in the history of present illness/assessment and plan.  Exam: [place] assistant Vitals:   07/24/16 1647  BP: 118/76   General appearance:  Normal Abdomen soft nontender without masses guarding rebound Pelvic external BUS vagina with mild atrophic changes. Cervix normal. Uterus anteverted normal size midline mobile nontender. Adnexa without masses or tenderness  Ultrasound shows uterus normal size and echotexture. 2 small myomas noted 22 mm, 18 mm, endometrial echo 5.8 mm. Right ovary with 2 thin-walled echo free cysts 25 mm x 20 mm and 9 mm. Left ovary normal. Cul-de-sac negative.  Sonohysterogram performed, sterile technique, easy catheter introduction, good distention with no abnormalities. Endometrial sample taken. Patient tolerated well  Assessment/Plan:  51 y.o. G1P1 with episode of brown staining 2 days. LMP 2 years ago. No associated symptoms such as pain cramping moliminal premenstrual symptoms. Ultrasound shows 2 small myomas and small benign-appearing right ovarian cyst. No intracavitary abnormalities such as polyps or endometrial thickening. Patient will follow up for biopsy results. Assuming negative then plan expectant management. She'll report if continued bleeding. If resolves then we'll follow expectantly.    Anastasio Auerbach MD, 4:57 PM 07/24/2016

## 2016-09-19 ENCOUNTER — Encounter: Payer: BLUE CROSS/BLUE SHIELD | Admitting: Women's Health

## 2016-09-20 ENCOUNTER — Ambulatory Visit (INDEPENDENT_AMBULATORY_CARE_PROVIDER_SITE_OTHER): Payer: BLUE CROSS/BLUE SHIELD | Admitting: Women's Health

## 2016-09-20 ENCOUNTER — Encounter: Payer: Self-pay | Admitting: Women's Health

## 2016-09-20 ENCOUNTER — Ambulatory Visit
Admission: RE | Admit: 2016-09-20 | Discharge: 2016-09-20 | Disposition: A | Discharge status: Home / Self Care | Payer: BLUE CROSS/BLUE SHIELD | Source: Ambulatory Visit | Attending: Women's Health | Admitting: Women's Health

## 2016-09-20 VITALS — BP 118/76 | Ht 59.0 in | Wt 128.0 lb

## 2016-09-20 DIAGNOSIS — Z1151 Encounter for screening for human papillomavirus (HPV): Secondary | ICD-10-CM

## 2016-09-20 DIAGNOSIS — Z13220 Encounter for screening for lipoid disorders: Secondary | ICD-10-CM

## 2016-09-20 DIAGNOSIS — Z01419 Encounter for gynecological examination (general) (routine) without abnormal findings: Secondary | ICD-10-CM | POA: Diagnosis not present

## 2016-09-20 DIAGNOSIS — G43809 Other migraine, not intractable, without status migrainosus: Secondary | ICD-10-CM | POA: Diagnosis not present

## 2016-09-20 DIAGNOSIS — Z1231 Encounter for screening mammogram for malignant neoplasm of breast: Secondary | ICD-10-CM

## 2016-09-20 DIAGNOSIS — Z13820 Encounter for screening for osteoporosis: Secondary | ICD-10-CM | POA: Diagnosis not present

## 2016-09-20 DIAGNOSIS — N952 Postmenopausal atrophic vaginitis: Secondary | ICD-10-CM

## 2016-09-20 LAB — CBC WITH DIFFERENTIAL/PLATELET
BASOS ABS: 0 {cells}/uL (ref 0–200)
Basophils Relative: 0 %
EOS PCT: 1 %
Eosinophils Absolute: 51 cells/uL (ref 15–500)
HCT: 44.2 % (ref 35.0–45.0)
Hemoglobin: 14.3 g/dL (ref 11.7–15.5)
Lymphocytes Relative: 28 %
Lymphs Abs: 1428 cells/uL (ref 850–3900)
MCH: 29.7 pg (ref 27.0–33.0)
MCHC: 32.4 g/dL (ref 32.0–36.0)
MCV: 91.7 fL (ref 80.0–100.0)
MONOS PCT: 8 %
MPV: 11.7 fL (ref 7.5–12.5)
Monocytes Absolute: 408 cells/uL (ref 200–950)
NEUTROS ABS: 3213 {cells}/uL (ref 1500–7800)
NEUTROS PCT: 63 %
PLATELETS: 174 10*3/uL (ref 140–400)
RBC: 4.82 MIL/uL (ref 3.80–5.10)
RDW: 13.7 % (ref 11.0–15.0)
WBC: 5.1 10*3/uL (ref 3.8–10.8)

## 2016-09-20 LAB — COMPREHENSIVE METABOLIC PANEL
ALT: 25 U/L (ref 6–29)
AST: 22 U/L (ref 10–35)
Albumin: 4.4 g/dL (ref 3.6–5.1)
Alkaline Phosphatase: 81 U/L (ref 33–130)
BUN: 14 mg/dL (ref 7–25)
CHLORIDE: 105 mmol/L (ref 98–110)
CO2: 23 mmol/L (ref 20–31)
CREATININE: 0.8 mg/dL (ref 0.50–1.05)
Calcium: 9.2 mg/dL (ref 8.6–10.4)
Glucose, Bld: 78 mg/dL (ref 65–99)
Potassium: 4.2 mmol/L (ref 3.5–5.3)
SODIUM: 137 mmol/L (ref 135–146)
Total Bilirubin: 0.8 mg/dL (ref 0.2–1.2)
Total Protein: 7.2 g/dL (ref 6.1–8.1)

## 2016-09-20 LAB — LIPID PANEL
Cholesterol: 226 mg/dL — ABNORMAL HIGH (ref <200)
HDL: 71 mg/dL (ref >50)
LDL CALC: 139 mg/dL — AB (ref <100)
Total CHOL/HDL Ratio: 3.2 Ratio (ref <5.0)
Triglycerides: 79 mg/dL (ref <150)
VLDL: 16 mg/dL (ref <30)

## 2016-09-20 MED ORDER — ESTRADIOL 10 MCG VA TABS: ORAL_TABLET | VAGINAL | 11 refills | Status: DC

## 2016-09-20 MED ORDER — RIZATRIPTAN BENZOATE 10 MG PO TABS: ORAL_TABLET | ORAL | 11 refills | Status: DC

## 2016-09-20 NOTE — Patient Instructions (Signed)
Health Maintenance for Postmenopausal Women Menopause is a normal process in which your reproductive ability comes to an end. This process happens gradually over a span of months to years, usually between the ages of 48 and 70. Menopause is complete when you have missed 12 consecutive menstrual periods. It is important to talk with your health care provider about some of the most common conditions that affect postmenopausal women, such as heart disease, cancer, and bone loss (osteoporosis). Adopting a healthy lifestyle and getting preventive care can help to promote your health and wellness. Those actions can also lower your chances of developing some of these common conditions. What should I know about menopause? During menopause, you may experience a number of symptoms, such as:  Moderate-to-severe hot flashes.  Night sweats.  Decrease in sex drive.  Mood swings.  Headaches.  Tiredness.  Irritability.  Memory problems.  Insomnia. Choosing to treat or not to treat menopausal changes is an individual decision that you make with your health care provider. What should I know about hormone replacement therapy and supplements? Hormone therapy products are effective for treating symptoms that are associated with menopause, such as hot flashes and night sweats. Hormone replacement carries certain risks, especially as you become older. If you are thinking about using estrogen or estrogen with progestin treatments, discuss the benefits and risks with your health care provider. What should I know about heart disease and stroke? Heart disease, heart attack, and stroke become more likely as you age. This may be due, in part, to the hormonal changes that your body experiences during menopause. These can affect how your body processes dietary fats, triglycerides, and cholesterol. Heart attack and stroke are both medical emergencies. There are many things that you can do to help prevent heart disease  and stroke:  Have your blood pressure checked at least every 1-2 years. High blood pressure causes heart disease and increases the risk of stroke.  If you are 15-40 years old, ask your health care provider if you should take aspirin to prevent a heart attack or a stroke.  Do not use any tobacco products, including cigarettes, chewing tobacco, or electronic cigarettes. If you need help quitting, ask your health care provider.  It is important to eat a healthy diet and maintain a healthy weight.  Be sure to include plenty of vegetables, fruits, low-fat dairy products, and lean protein.  Avoid eating foods that are high in solid fats, added sugars, or salt (sodium).  Get regular exercise. This is one of the most important things that you can do for your health.  Try to exercise for at least 150 minutes each week. The type of exercise that you do should increase your heart rate and make you sweat. This is known as moderate-intensity exercise.  Try to do strengthening exercises at least twice each week. Do these in addition to the moderate-intensity exercise.  Know your numbers.Ask your health care provider to check your cholesterol and your blood glucose. Continue to have your blood tested as directed by your health care provider. What should I know about cancer screening? There are several types of cancer. Take the following steps to reduce your risk and to catch any cancer development as early as possible. Breast Cancer  Practice breast self-awareness.  This means understanding how your breasts normally appear and feel.  It also means doing regular breast self-exams. Let your health care provider know about any changes, no matter how small.  If you are 40 or older,  have a clinician do a breast exam (clinical breast exam or CBE) every year. Depending on your age, family history, and medical history, it may be recommended that you also have a yearly breast X-ray (mammogram).  If you  have a family history of breast cancer, talk with your health care provider about genetic screening.  If you are at high risk for breast cancer, talk with your health care provider about having an MRI and a mammogram every year.  Breast cancer (BRCA) gene test is recommended for women who have family members with BRCA-related cancers. Results of the assessment will determine the need for genetic counseling and BRCA1 and for BRCA2 testing. BRCA-related cancers include these types:  Breast. This occurs in males or females.  Ovarian.  Tubal. This may also be called fallopian tube cancer.  Cancer of the abdominal or pelvic lining (peritoneal cancer).  Prostate.  Pancreatic. Cervical, Uterine, and Ovarian Cancer  Your health care provider may recommend that you be screened regularly for cancer of the pelvic organs. These include your ovaries, uterus, and vagina. This screening involves a pelvic exam, which includes checking for microscopic changes to the surface of your cervix (Pap test).  For women ages 21-65, health care providers may recommend a pelvic exam and a Pap test every three years. For women ages 61-65, they may recommend the Pap test and pelvic exam, combined with testing for human papilloma virus (HPV), every five years. Some types of HPV increase your risk of cervical cancer. Testing for HPV may also be done on women of any age who have unclear Pap test results.  Other health care providers may not recommend any screening for nonpregnant women who are considered low risk for pelvic cancer and have no symptoms. Ask your health care provider if a screening pelvic exam is right for you.  If you have had past treatment for cervical cancer or a condition that could lead to cancer, you need Pap tests and screening for cancer for at least 20 years after your treatment. If Pap tests have been discontinued for you, your risk factors (such as having a new sexual partner) need to be reassessed  to determine if you should start having screenings again. Some women have medical problems that increase the chance of getting cervical cancer. In these cases, your health care provider may recommend that you have screening and Pap tests more often.  If you have a family history of uterine cancer or ovarian cancer, talk with your health care provider about genetic screening.  If you have vaginal bleeding after reaching menopause, tell your health care provider.  There are currently no reliable tests available to screen for ovarian cancer. Lung Cancer  Lung cancer screening is recommended for adults 69-53 years old who are at high risk for lung cancer because of a history of smoking. A yearly low-dose CT scan of the lungs is recommended if you:  Currently smoke.  Have a history of at least 30 pack-years of smoking and you currently smoke or have quit within the past 15 years. A pack-year is smoking an average of one pack of cigarettes per day for one year. Yearly screening should:  Continue until it has been 15 years since you quit.  Stop if you develop a health problem that would prevent you from having lung cancer treatment. Colorectal Cancer  This type of cancer can be detected and can often be prevented.  Routine colorectal cancer screening usually begins at age 18 and continues  through age 75.  If you have risk factors for colon cancer, your health care provider may recommend that you be screened at an earlier age.  If you have a family history of colorectal cancer, talk with your health care provider about genetic screening.  Your health care provider may also recommend using home test kits to check for hidden blood in your stool.  A small camera at the end of a tube can be used to examine your colon directly (sigmoidoscopy or colonoscopy). This is done to check for the earliest forms of colorectal cancer.  Direct examination of the colon should be repeated every 5-10 years until  age 75. However, if early forms of precancerous polyps or small growths are found or if you have a family history or genetic risk for colorectal cancer, you may need to be screened more often. Skin Cancer  Check your skin from head to toe regularly.  Monitor any moles. Be sure to tell your health care provider:  About any new moles or changes in moles, especially if there is a change in a mole's shape or color.  If you have a mole that is larger than the size of a pencil eraser.  If any of your family members has a history of skin cancer, especially at a  age, talk with your health care provider about genetic screening.  Always use sunscreen. Apply sunscreen liberally and repeatedly throughout the day.  Whenever you are outside, protect yourself by wearing long sleeves, pants, a wide-brimmed hat, and sunglasses. What should I know about osteoporosis? Osteoporosis is a condition in which bone destruction happens more quickly than new bone creation. After menopause, you may be at an increased risk for osteoporosis. To help prevent osteoporosis or the bone fractures that can happen because of osteoporosis, the following is recommended:  If you are 19-50 years old, get at least 1,000 mg of calcium and at least 600 mg of vitamin D per day.  If you are older than age 50 but er than age 70, get at least 1,200 mg of calcium and at least 600 mg of vitamin D per day.  If you are older than age 70, get at least 1,200 mg of calcium and at least 800 mg of vitamin D per day. Smoking and excessive alcohol intake increase the risk of osteoporosis. Eat foods that are rich in calcium and vitamin D, and do weight-bearing exercises several times each week as directed by your health care provider. What should I know about how menopause affects my mental health? Depression may occur at any age, but it is more common as you become older. Common symptoms of depression include:  Low or sad  mood.  Changes in sleep patterns.  Changes in appetite or eating patterns.  Feeling an overall lack of motivation or enjoyment of activities that you previously enjoyed.  Frequent crying spells. Talk with your health care provider if you think that you are experiencing depression. What should I know about immunizations? It is important that you get and maintain your immunizations. These include:  Tetanus, diphtheria, and pertussis (Tdap) booster vaccine.  Influenza every year before the flu season begins.  Pneumonia vaccine.  Shingles vaccine. Your health care provider may also recommend other immunizations. This information is not intended to replace advice given to you by your health care provider. Make sure you discuss any questions you have with your health care provider. Document Released: 08/23/2005 Document Revised: 01/19/2016 Document Reviewed: 04/04/2015 Elsevier Interactive Patient   Education  2017 Elsevier Inc.  

## 2016-09-20 NOTE — Progress Notes (Signed)
Beth Wilkerson 10/17/1965 [id number]    History:    Presents for annual exam. Postmenopausal no bleeding. Had irregular bleeding in December had a negative sonohysterogram in negative biopsy January 2018. Normal Pap and mammogram history. Negative colonoscopy 10-2015. Vaccination up to date. History of migraines, uses occasional Maxalt with good relief no changes in headache, has seen a neurologist in the past.  Past medical history, past surgical history, family history and social history were all reviewed and documented in the EPIC chart. 10 year old son is going to live with her sister in [place] for 1 year.  ROS:  A ROS was performed and pertinent positives and negatives are included.  Exam:appears well  Vitals:   09/20/16 0816  BP: 118/76  Weight: 128 lb (58.1 kg)  Height: 4\' 11"  (1.499 m)   Body mass index is 25.85 kg/m.   General appearance:  Normal Thyroid:  Symmetrical, normal in size, without palpable masses or nodularity. Respiratory  Auscultation:  Clear without wheezing or rhonchi Cardiovascular  Auscultation:  Regular rate, without rubs, murmurs or gallops  Edema/varicosities:  Not grossly evident Abdominal  Soft,nontender, without masses, guarding or rebound.  Liver/spleen:  No organomegaly noted  Hernia:  None appreciated  Skin  Inspection:  Grossly normal   Breasts: Examined lying and sitting.     Right: Without masses, retractions, discharge or axillary adenopathy.     Left: Without masses, retractions, discharge or axillary adenopathy. Gentitourinary   Inguinal/mons:  Normal without inguinal adenopathy  External genitalia:  Normal  BUS/Urethra/Skene's glands:  Normal  Vagina:  Atrophic  Cervix:  Normal, dry. Pap screen performed   Uterus:   normal in size, shape and contour.  Midline and mobile  Adnexa/parametria:     Rt: Without masses or tenderness.   Lt: Without masses or tenderness.  Anus and perineum: Normal  Digital rectal exam: Normal  sphincter tone without palpated masses or tenderness  Assessment/Plan:  51 y.o. MHF G1P1 for annual exam.    Postmenopausal with vaginal atrophy/dyspareunia  Migraines occasional Maxalt   Plan: Maxalt 10 mg po , repeat in 2 hors prn no more than 2 tablets in 24 hours if needed prescription with refill given. Reviewed importance of follow-up with neurologist if any headache changes. Vaginal atrophy reviewed will try Vagifem 10 g at bedtime vaginally daily for 2 weeks and then twice weekly thereafter after. Continue vaginal lubricants. SBEs, continue annual 3-D screening mammogram, Vit D3 2000 u daily, continue active lifestyle, diet and exercise. Labs: CBC, CPM, lipid panel. Pap with HR HPV typing  done, screening guidelines reviewed.. Dexa, and safety, fall prevention and importance of weightbearing exercise reviewed encouraged yoga.   Huel Cote [facility], 9:13 AM 09/20/2016

## 2016-09-20 NOTE — Addendum Note (Signed)
Addended by: Thurnell Garbe A on: 09/20/2016 09:40 AM   Modules accepted: Orders

## 2016-09-23 LAB — PAP, TP IMAGING W/ HPV RNA, RFLX HPV TYPE 16,18/45: HPV MRNA, HIGH RISK: NOT DETECTED

## 2016-10-03 ENCOUNTER — Ambulatory Visit: Payer: Self-pay

## 2017-02-24 ENCOUNTER — Telehealth: Payer: Self-pay | Admitting: *Deleted

## 2017-02-24 DIAGNOSIS — G43809 Other migraine, not intractable, without status migrainosus: Secondary | ICD-10-CM

## 2017-02-24 MED ORDER — RIZATRIPTAN BENZOATE 10 MG PO TABS: ORAL_TABLET | ORAL | 8 refills | Status: DC

## 2017-02-24 NOTE — Telephone Encounter (Signed)
Pt called stating pharmacy will not refill her Maxalt 10 mg tablet when Rx was approved on 09/20/16 with 11 refills. A note on Rx stating pt needs office visit for refills, pt was seen in March 2018. Rx sent with note removed.

## 2017-08-06 ENCOUNTER — Other Ambulatory Visit: Payer: Self-pay | Admitting: Women's Health

## 2017-08-06 DIAGNOSIS — Z139 Encounter for screening, unspecified: Secondary | ICD-10-CM

## 2017-09-22 ENCOUNTER — Encounter: Payer: BLUE CROSS/BLUE SHIELD | Admitting: Women's Health

## 2017-09-22 ENCOUNTER — Ambulatory Visit: Payer: Self-pay

## 2017-12-23 ENCOUNTER — Encounter: Payer: BLUE CROSS/BLUE SHIELD | Admitting: Women's Health

## 2018-08-03 ENCOUNTER — Other Ambulatory Visit: Payer: Self-pay | Admitting: Women's Health

## 2018-08-03 DIAGNOSIS — Z1231 Encounter for screening mammogram for malignant neoplasm of breast: Secondary | ICD-10-CM

## 2018-09-22 ENCOUNTER — Ambulatory Visit
Admission: RE | Admit: 2018-09-22 | Discharge: 2018-09-22 | Disposition: A | Discharge status: Home / Self Care | Payer: PRIVATE HEALTH INSURANCE | Source: Ambulatory Visit | Attending: Women's Health | Admitting: Women's Health

## 2018-09-22 ENCOUNTER — Encounter: Payer: Self-pay | Admitting: Women's Health

## 2018-09-22 ENCOUNTER — Ambulatory Visit (INDEPENDENT_AMBULATORY_CARE_PROVIDER_SITE_OTHER): Payer: No Typology Code available for payment source | Admitting: Women's Health

## 2018-09-22 VITALS — BP 120/82 | Ht 59.0 in | Wt 135.0 lb

## 2018-09-22 DIAGNOSIS — Z01419 Encounter for gynecological examination (general) (routine) without abnormal findings: Secondary | ICD-10-CM

## 2018-09-22 DIAGNOSIS — N952 Postmenopausal atrophic vaginitis: Secondary | ICD-10-CM | POA: Diagnosis not present

## 2018-09-22 DIAGNOSIS — Z13220 Encounter for screening for lipoid disorders: Secondary | ICD-10-CM

## 2018-09-22 DIAGNOSIS — Z1231 Encounter for screening mammogram for malignant neoplasm of breast: Secondary | ICD-10-CM

## 2018-09-22 LAB — COMPREHENSIVE METABOLIC PANEL
AG Ratio: 1.7 (calc) (ref 1.0–2.5)
ALT: 24 U/L (ref 6–29)
AST: 22 U/L (ref 10–35)
Albumin: 4.5 g/dL (ref 3.6–5.1)
Alkaline phosphatase (APISO): 94 U/L (ref 37–153)
BILIRUBIN TOTAL: 0.8 mg/dL (ref 0.2–1.2)
BUN: 12 mg/dL (ref 7–25)
CHLORIDE: 104 mmol/L (ref 98–110)
CO2: 27 mmol/L (ref 20–32)
Calcium: 9.5 mg/dL (ref 8.6–10.4)
Creat: 0.76 mg/dL (ref 0.50–1.05)
Globulin: 2.7 g/dL (calc) (ref 1.9–3.7)
Glucose, Bld: 90 mg/dL (ref 65–99)
Potassium: 3.9 mmol/L (ref 3.5–5.3)
Sodium: 139 mmol/L (ref 135–146)
Total Protein: 7.2 g/dL (ref 6.1–8.1)

## 2018-09-22 LAB — CBC WITH DIFFERENTIAL/PLATELET
Absolute Monocytes: 310 cells/uL (ref 200–950)
BASOS PCT: 0.5 %
Basophils Absolute: 22 cells/uL (ref 0–200)
Eosinophils Absolute: 52 cells/uL (ref 15–500)
Eosinophils Relative: 1.2 %
HEMATOCRIT: 44 % (ref 35.0–45.0)
HEMOGLOBIN: 14.4 g/dL (ref 11.7–15.5)
Lymphs Abs: 1582 cells/uL (ref 850–3900)
MCH: 29.8 pg (ref 27.0–33.0)
MCHC: 32.7 g/dL (ref 32.0–36.0)
MCV: 91.1 fL (ref 80.0–100.0)
MPV: 12.9 fL — AB (ref 7.5–12.5)
Monocytes Relative: 7.2 %
Neutro Abs: 2335 cells/uL (ref 1500–7800)
Neutrophils Relative %: 54.3 %
Platelets: 152 10*3/uL (ref 140–400)
RBC: 4.83 10*6/uL (ref 3.80–5.10)
RDW: 13.2 % (ref 11.0–15.0)
Total Lymphocyte: 36.8 %
WBC: 4.3 10*3/uL (ref 3.8–10.8)

## 2018-09-22 LAB — LIPID PANEL
Cholesterol: 262 mg/dL — ABNORMAL HIGH (ref <200)
HDL: 66 mg/dL (ref >=50)
LDL Cholesterol (Calc): 172 mg/dL (calc) — ABNORMAL HIGH
Non-HDL Cholesterol (Calc): 196 mg/dL (calc) — ABNORMAL HIGH (ref <130)
Total CHOL/HDL Ratio: 4 (calc) (ref <5.0)
Triglycerides: 109 mg/dL (ref <150)

## 2018-09-22 MED ORDER — ESTRADIOL 10 MCG VA TABS: ORAL_TABLET | VAGINAL | 4 refills | Status: DC

## 2018-09-22 NOTE — Progress Notes (Signed)
Beth Wilkerson [birth year] [id number]    History:    Presents for annual exam.  Postmenopausal on no HRT with no bleeding.  2018 had vaginal dryness good relief on Vagifem but did stop, lost insurance.  Normal pap and mammogram. 2017 Neg colonoscopy.    Past medical history, past surgical history, family history and social history were all reviewed and documented in the EPIC chart. desk job.  Brother lives in [place] died from anesthesia complication from appendectomy last year.  From [place], sister in [place], one sister lives in [place].  Son 21 doing well.   ROS:  A ROS was performed and pertinent positives and negatives are included.  Exam:  Vitals:   09/22/18 0828  BP: 120/82  Weight: 135 lb (61.2 kg)  Height: 4\' 11"  (1.499 m)   Body mass index is 27.27 kg/m.   General appearance:  Normal Thyroid:  Symmetrical, normal in size, without palpable masses or nodularity. Respiratory  Auscultation:  Clear without wheezing or rhonchi Cardiovascular  Auscultation:  Regular rate, without rubs, murmurs or gallops  Edema/varicosities:  Not grossly evident Abdominal  Soft,nontender, without masses, guarding or rebound.  Liver/spleen:  No organomegaly noted  Hernia:  None appreciated  Skin  Inspection:  Grossly normal   Breasts: Examined lying and sitting.     Right: Without masses, retractions, discharge or axillary adenopathy.     Left: Without masses, retractions, discharge or axillary adenopathy. Gentitourinary   Inguinal/mons:  Normal without inguinal adenopathy  External genitalia:  Normal  BUS/Urethra/Skene's glands:  Normal  Vagina: Mild vaginal atrophy  Cervix:  Normal  Uterus:   normal in size, shape and contour.  Midline and mobile  Adnexa/parametria:     Rt: Without masses or tenderness.   Lt: Without masses or tenderness.  Anus and perineum: Normal  Digital rectal exam: Normal sphincter tone without palpated masses or tenderness  Assessment/Plan:  53 y.o. MWF G1, P1 for  annual exam with complaint of vaginal dryness causing discomfort with intercourse.  Postmenopausal on no HRT with vaginal atrophy  Plan: Options reviewed, Vagifem prescription, proper use given and reviewed place vaginally  daily for 2 weeks and then twice weekly.  Continue over-the-counter vaginal lubricants.  SBEs, continue annual screening mammogram, calcium rich foods, vitamin D 2000 daily encouraged.  Increase regular exercise.  CBC, CMP, lipid panel, Pap normal 2018, new screening guidelines reviewed.   [place], 8:33 AM 09/22/2018

## 2018-09-22 NOTE — Patient Instructions (Addendum)
Vit D 2000 iu daily  Red rice yeast supp  Health Maintenance for Postmenopausal Women Menopause is a normal process in which your reproductive ability comes to an end. This process happens gradually over a span of months to years, usually between the ages of 76 and 52. Menopause is complete when you have missed 12 consecutive menstrual periods. It is important to talk with your health care provider about some of the most common conditions that affect postmenopausal women, such as heart disease, cancer, and bone loss (osteoporosis). Adopting a healthy lifestyle and getting preventive care can help to promote your health and wellness. Those actions can also lower your chances of developing some of these common conditions. What should I know about menopause? During menopause, you may experience a number of symptoms, such as:  Moderate-to-severe hot flashes.  Night sweats.  Decrease in sex drive.  Mood swings.  Headaches.  Tiredness.  Irritability.  Memory problems.  Insomnia. Choosing to treat or not to treat menopausal changes is an individual decision that you make with your health care provider. What should I know about hormone replacement therapy and supplements? Hormone therapy products are effective for treating symptoms that are associated with menopause, such as hot flashes and night sweats. Hormone replacement carries certain risks, especially as you become older. If you are thinking about using estrogen or estrogen with progestin treatments, discuss the benefits and risks with your health care provider. What should I know about heart disease and stroke? Heart disease, heart attack, and stroke become more likely as you age. This may be due, in part, to the hormonal changes that your body experiences during menopause. These can affect how your body processes dietary fats, triglycerides, and cholesterol. Heart attack and stroke are both medical emergencies. There are many things  that you can do to help prevent heart disease and stroke:  Have your blood pressure checked at least every 1-2 years. High blood pressure causes heart disease and increases the risk of stroke.  If you are 29-34 years old, ask your health care provider if you should take aspirin to prevent a heart attack or a stroke.  Do not use any tobacco products, including cigarettes, chewing tobacco, or electronic cigarettes. If you need help quitting, ask your health care provider.  It is important to eat a healthy diet and maintain a healthy weight. ? Be sure to include plenty of vegetables, fruits, low-fat dairy products, and lean protein. ? Avoid eating foods that are high in solid fats, added sugars, or salt (sodium).  Get regular exercise. This is one of the most important things that you can do for your health. ? Try to exercise for at least 150 minutes each week. The type of exercise that you do should increase your heart rate and make you sweat. This is known as moderate-intensity exercise. ? Try to do strengthening exercises at least twice each week. Do these in addition to the moderate-intensity exercise.  Know your numbers.Ask your health care provider to check your cholesterol and your blood glucose. Continue to have your blood tested as directed by your health care provider.  What should I know about cancer screening? There are several types of cancer. Take the following steps to reduce your risk and to catch any cancer development as early as possible. Breast Cancer  Practice breast self-awareness. ? This means understanding how your breasts normally appear and feel. ? It also means doing regular breast self-exams. Let your health care provider know about any  changes, no matter how small.  If you are 68 or older, have a clinician do a breast exam (clinical breast exam or CBE) every year. Depending on your age, family history, and medical history, it may be recommended that you also have  a yearly breast X-ray (mammogram).  If you have a family history of breast cancer, talk with your health care provider about genetic screening.  If you are at high risk for breast cancer, talk with your health care provider about having an MRI and a mammogram every year.  Breast cancer (BRCA) gene test is recommended for women who have family members with BRCA-related cancers. Results of the assessment will determine the need for genetic counseling and BRCA1 and for BRCA2 testing. BRCA-related cancers include these types: ? Breast. This occurs in males or females. ? Ovarian. ? Tubal. This may also be called fallopian tube cancer. ? Cancer of the abdominal or pelvic lining (peritoneal cancer). ? Prostate. ? Pancreatic. Cervical, Uterine, and Ovarian Cancer Your health care provider may recommend that you be screened regularly for cancer of the pelvic organs. These include your ovaries, uterus, and vagina. This screening involves a pelvic exam, which includes checking for microscopic changes to the surface of your cervix (Pap test).  For women ages 21-65, health care providers may recommend a pelvic exam and a Pap test every three years. For women ages 70-65, they may recommend the Pap test and pelvic exam, combined with testing for human papilloma virus (HPV), every five years. Some types of HPV increase your risk of cervical cancer. Testing for HPV may also be done on women of any age who have unclear Pap test results.  Other health care providers may not recommend any screening for nonpregnant women who are considered low risk for pelvic cancer and have no symptoms. Ask your health care provider if a screening pelvic exam is right for you.  If you have had past treatment for cervical cancer or a condition that could lead to cancer, you need Pap tests and screening for cancer for at least 20 years after your treatment. If Pap tests have been discontinued for you, your risk factors (such as having  a new sexual partner) need to be reassessed to determine if you should start having screenings again. Some women have medical problems that increase the chance of getting cervical cancer. In these cases, your health care provider may recommend that you have screening and Pap tests more often.  If you have a family history of uterine cancer or ovarian cancer, talk with your health care provider about genetic screening.  If you have vaginal bleeding after reaching menopause, tell your health care provider.  There are currently no reliable tests available to screen for ovarian cancer. Lung Cancer Lung cancer screening is recommended for adults 8-24 years old who are at high risk for lung cancer because of a history of smoking. A yearly low-dose CT scan of the lungs is recommended if you:  Currently smoke.  Have a history of at least 30 pack-years of smoking and you currently smoke or have quit within the past 15 years. A pack-year is smoking an average of one pack of cigarettes per day for one year. Yearly screening should:  Continue until it has been 15 years since you quit.  Stop if you develop a health problem that would prevent you from having lung cancer treatment. Colorectal Cancer  This type of cancer can be detected and can often be prevented.  Routine  colorectal cancer screening usually begins at age 52 and continues through age 60.  If you have risk factors for colon cancer, your health care provider may recommend that you be screened at an earlier age.  If you have a family history of colorectal cancer, talk with your health care provider about genetic screening.  Your health care provider may also recommend using home test kits to check for hidden blood in your stool.  A small camera at the end of a tube can be used to examine your colon directly (sigmoidoscopy or colonoscopy). This is done to check for the earliest forms of colorectal cancer.  Direct examination of the  colon should be repeated every 5-10 years until age 71. However, if early forms of precancerous polyps or small growths are found or if you have a family history or genetic risk for colorectal cancer, you may need to be screened more often. Skin Cancer  Check your skin from head to toe regularly.  Monitor any moles. Be sure to tell your health care provider: ? About any new moles or changes in moles, especially if there is a change in a mole's shape or color. ? If you have a mole that is larger than the size of a pencil eraser.  If any of your family members has a history of skin cancer, especially at a [place] age, talk with your health care provider about genetic screening.  Always use sunscreen. Apply sunscreen liberally and repeatedly throughout the day.  Whenever you are outside, protect yourself by wearing long sleeves, pants, a wide-brimmed hat, and sunglasses. What should I know about osteoporosis? Osteoporosis is a condition in which bone destruction happens more quickly than new bone creation. After menopause, you may be at an increased risk for osteoporosis. To help prevent osteoporosis or the bone fractures that can happen because of osteoporosis, the following is recommended:  If you are 58-44 years old, get at least 1,000 mg of calcium and at least 600 mg of vitamin D per day.  If you are older than age 62 but younger than age 55, get at least 1,200 mg of calcium and at least 600 mg of vitamin D per day.  If you are older than age 68, get at least 1,200 mg of calcium and at least 800 mg of vitamin D per day. Smoking and excessive alcohol intake increase the risk of osteoporosis. Eat foods that are rich in calcium and vitamin D, and do weight-bearing exercises several times each week as directed by your health care provider. What should I know about how menopause affects my mental health? Depression may occur at any age, but it is more common as you become older. Common symptoms of  depression include:  Low or sad mood.  Changes in sleep patterns.  Changes in appetite or eating patterns.  Feeling an overall lack of motivation or enjoyment of activities that you previously enjoyed.  Frequent crying spells. Talk with your health care provider if you think that you are experiencing depression. What should I know about immunizations? It is important that you get and maintain your immunizations. These include:  Tetanus, diphtheria, and pertussis (Tdap) booster vaccine.  Influenza every year before the flu season begins.  Pneumonia vaccine.  Shingles vaccine. Your health care provider may also recommend other immunizations. This information is not intended to replace advice given to you by your health care provider. Make sure you discuss any questions you have with your health care provider. Document Released:  08/23/2005 Document Revised: 01/19/2016 Document Reviewed: 04/04/2015 Elsevier Interactive Patient Education  [employer].

## 2018-12-04 ENCOUNTER — Ambulatory Visit
Admission: RE | Admit: 2018-12-04 | Discharge: 2018-12-04 | Disposition: A | Discharge status: Home / Self Care | Payer: No Typology Code available for payment source | Source: Ambulatory Visit | Attending: Nurse Practitioner | Admitting: Nurse Practitioner

## 2018-12-04 ENCOUNTER — Other Ambulatory Visit: Payer: Self-pay

## 2018-12-04 ENCOUNTER — Other Ambulatory Visit: Payer: Self-pay | Admitting: Nurse Practitioner

## 2018-12-04 DIAGNOSIS — R52 Pain, unspecified: Secondary | ICD-10-CM

## 2018-12-21 ENCOUNTER — Ambulatory Visit: Payer: No Typology Code available for payment source | Admitting: Family Medicine

## 2019-01-01 ENCOUNTER — Other Ambulatory Visit: Payer: Self-pay

## 2019-01-04 ENCOUNTER — Other Ambulatory Visit: Payer: Self-pay

## 2019-01-04 ENCOUNTER — Ambulatory Visit (INDEPENDENT_AMBULATORY_CARE_PROVIDER_SITE_OTHER): Payer: Self-pay | Admitting: Women's Health

## 2019-01-04 ENCOUNTER — Encounter: Payer: Self-pay | Admitting: Women's Health

## 2019-01-04 VITALS — BP 120/80

## 2019-01-04 DIAGNOSIS — N950 Postmenopausal bleeding: Secondary | ICD-10-CM

## 2019-01-04 MED ORDER — IBUPROFEN 600 MG PO TABS: 600.0000 mg | ORAL_TABLET | Freq: Three times a day (TID) | ORAL | 1 refills | Status: DC | PRN

## 2019-01-04 NOTE — Progress Notes (Signed)
53 year old M WF G1 P1 presents with 4 days of pink to brown spotting only noted on toilet tissue.  Postmenopausal on Vagifem only LMP 3 years ago.  Denies vaginal discharge, urinary symptoms, abdominal pain or fever.  Denies visible blood in urine.  Has had good relief of dryness with Vagifem.  No known medical problems.  Currently without insurance requests minimum.  Reports increased situational stress has been out of work due to [employer], states had menstrual irregularities related to stress when younger.  Exam: Appears well, slightly anxious. GU: External genitalia within normal limits, speculum exam mild vaginal atrophy, no visible discharge, blood or brown discharge, wet prep negative.  Cervix appears normal with no visible polyps or bleeding.  No blood seen in cervical os when Q-tip inserted.  Bimanual  no CMT or adnexal tenderness.  Postmenopausal on Vagifem with 4 days of questionable vaginal spotting  Plan: Reviewed normality of exam, no visible brown or blood noted with exam.  Reviewed if further bleeding ultrasound will be needed.  Encouraged vaginal lubricants with intercourse. Continue Vagifem.  Also has having some right upper shoulder muscle tension, will use Motrin 600 mg every 6 hours as needed, avoid overuse, massage as needed.

## 2019-01-04 NOTE — Patient Instructions (Signed)
Call if vag BLEEDING  Muscle Pain, Adult Muscle pain (myalgia) may be mild or severe. In most cases, the pain lasts only a short time and it goes away without treatment. It is normal to feel some muscle pain after starting a workout program. Muscles that have not been used often will be sore at first. Muscle pain may also be caused by many other things, including:  Overuse or muscle strain, especially if you are not in shape. This is the most common cause of muscle pain.  Injury.  Bruises.  Viruses, such as the flu.  Infectious diseases.  A chronic condition that causes muscle tenderness, fatigue, and headache (fibromyalgia).  A condition, such as lupus, in which the body's disease-fighting system attacks other organs in the body (autoimmune or rheumatologic diseases).  Certain drugs, including ACE inhibitors and statins. To diagnose the cause of your muscle pain, your health care provider will do a physical exam and ask questions about the pain and when it began. If you have not had muscle pain for very long, your health care provider may want to wait before doing much testing. If your muscle pain has lasted a long time, your health care provider may want to run tests right away. In some cases, this may include tests to rule out certain conditions or illnesses. Treatment for muscle pain depends on the cause. Home care is often enough to relieve muscle pain. Your health care provider may also prescribe anti-inflammatory medicine. Follow these instructions at home: Activity  If overuse is causing your muscle pain: ? Slow down your activities until the pain goes away. ? Do regular, gentle exercises if you are not usually active. ? Warm up before exercising. Stretch before and after exercising. This can help lower the risk of muscle pain.  Do not continue working out if the pain is very bad. Bad pain could mean that you have injured a muscle. Managing pain and discomfort   If  directed, apply ice to the sore muscle: ? Put ice in a plastic bag. ? Place a towel between your skin and the bag. ? Leave the ice on for 20 minutes, 2-3 times a day.  You may also alternate between applying ice and applying heat as told by your health care provider. To apply heat, use the heat source that your health care provider recommends, such as a moist heat pack or a heating pad. ? Place a towel between your skin and the heat source. ? Leave the heat on for 20-30 minutes. ? Remove the heat if your skin turns bright red. This is especially important if you are unable to feel pain, heat, or cold. You may have a greater risk of getting burned. Medicines  Take over-the-counter and prescription medicines only as told by your health care provider.  Do not drive or use heavy machinery while taking prescription pain medicine. Contact a health care provider if:  Your muscle pain gets worse and medicines do not help.  You have muscle pain that lasts longer than 3 days.  You have a rash or fever along with muscle pain.  You have muscle pain after a tick bite.  You have muscle pain while working out, even though you are in good physical condition.  You have redness, soreness, or swelling along with muscle pain.  You have muscle pain after starting a new medicine or changing the dose of a medicine. Get help right away if:  You have trouble breathing.  You have trouble  swallowing.  You have muscle pain along with a stiff neck, fever, and vomiting.  You have severe muscle weakness or cannot move part of your body. This information is not intended to replace advice given to you by your health care provider. Make sure you discuss any questions you have with your health care provider. Document Released: 05/23/2006 Document Revised: 01/19/2016 Document Reviewed: 11/21/2015 Elsevier Interactive Patient Education  2019 [employer].

## 2019-01-15 ENCOUNTER — Ambulatory Visit: Payer: No Typology Code available for payment source | Admitting: Family Medicine

## 2019-04-07 ENCOUNTER — Encounter: Payer: Self-pay | Admitting: Gynecology

## 2020-04-23 ENCOUNTER — Encounter: Payer: Self-pay | Admitting: Family Medicine

## 2020-05-12 ENCOUNTER — Other Ambulatory Visit: Payer: Self-pay

## 2020-10-19 ENCOUNTER — Other Ambulatory Visit: Payer: Self-pay | Admitting: Nurse Practitioner

## 2020-10-19 DIAGNOSIS — Z1231 Encounter for screening mammogram for malignant neoplasm of breast: Secondary | ICD-10-CM

## 2020-11-14 ENCOUNTER — Other Ambulatory Visit: Payer: Self-pay

## 2020-11-14 ENCOUNTER — Encounter: Payer: Self-pay | Admitting: Nurse Practitioner

## 2020-11-14 ENCOUNTER — Ambulatory Visit (INDEPENDENT_AMBULATORY_CARE_PROVIDER_SITE_OTHER): Payer: PRIVATE HEALTH INSURANCE | Admitting: Nurse Practitioner

## 2020-11-14 VITALS — BP 130/80 | Ht 59.0 in | Wt 135.0 lb

## 2020-11-14 DIAGNOSIS — N951 Menopausal and female climacteric states: Secondary | ICD-10-CM | POA: Diagnosis not present

## 2020-11-14 DIAGNOSIS — Z780 Asymptomatic menopausal state: Secondary | ICD-10-CM | POA: Diagnosis not present

## 2020-11-14 DIAGNOSIS — Z01419 Encounter for gynecological examination (general) (routine) without abnormal findings: Secondary | ICD-10-CM | POA: Diagnosis not present

## 2020-11-14 DIAGNOSIS — Z8639 Personal history of other endocrine, nutritional and metabolic disease: Secondary | ICD-10-CM | POA: Diagnosis not present

## 2020-11-14 NOTE — Patient Instructions (Signed)

## 2020-11-14 NOTE — Progress Notes (Signed)
   Beth Wilkerson 30-Jan-1966 [id number]   History:  55 y.o. G1P1 presents for annual exam. Postmenopausal - noHRT. Complains of pain and bleeding with intercourse. Used Vagifem in the past with good relief but it was very expensive. She uses lubrication with intercourse. Normal pap and mammogram history.   Gynecologic History Patient's last menstrual period was 09/21/2014.   Contraception/Family planning: post menopausal status  Health Maintenance Last Pap: 09/30/2016. Results were: normal Last mammogram: 09/22/2018. Results were: normal Last colonoscopy: 2017. Results were: normal, 10-year recall Last Dexa: N/A  Past medical history, past surgical history, family history and social history were all reviewed and documented in the EPIC chart. Married. 69 yo son. From [place].   ROS:  A ROS was performed and pertinent positives and negatives are included.  Exam:  Vitals:   11/14/20 0826  BP: 130/80  Weight: 135 lb (61.2 kg)  Height: 4\' 11"  (1.499 m)   Body mass index is 27.27 kg/m.  General appearance:  Normal Thyroid:  Symmetrical, normal in size, without palpable masses or nodularity. Respiratory  Auscultation:  Clear without wheezing or rhonchi Cardiovascular  Auscultation:  Regular rate, without rubs, murmurs or gallops  Edema/varicosities:  Not grossly evident Abdominal  Soft,nontender, without masses, guarding or rebound.  Liver/spleen:  No organomegaly noted  Hernia:  None appreciated  Skin  Inspection:  Grossly normal Breasts: Examined lying and sitting.   Right: Without masses, retractions, nipple discharge or axillary adenopathy.   Left: Without masses, retractions, nipple discharge or axillary adenopathy. Gentitourinary   Inguinal/mons:  Normal without inguinal adenopathy  External genitalia:  Normal appearing vulva with no masses, tenderness, or lesions  BUS/Urethra/Skene's glands:  Normal  Vagina:  Atrophic changes, no discharge, no lesions.   Cervix:   Normal appearing without discharge or lesions  Uterus:  Normal in size, shape and contour.  Midline and mobile, nontender  Adnexa/parametria:     Rt: Normal in size, without masses or tenderness.   Lt: Normal in size, without masses or tenderness.  Anus and perineum: Non-bleeding external hemorrhoids  Digital rectal exam: Normal sphincter tone without palpated masses or tenderness  Assessment/Plan:  55 y.o. G1P1 for annual exam.   Well female exam with routine gynecological exam - Plan: CBC with Differential/Platelet, Comprehensive metabolic panel, Lipid panel, TSH. Education provided on SBEs, importance of preventative screenings, current guidelines, high calcium diet, regular exercise, and multivitamin daily.   Postmenopausal - no HRT, no bleeding.   Menopausal vaginal dryness - We discussed compounded vaginal estrogen cream and she would like to try. Instructions are to use nightly x 2 weeks, then decrease to every other night x 2 weeks, and then twice weekly for maintenance. Continue using coconut oil for lubricant with intercourse.   History of vitamin D deficiency - Plan: VITAMIN D 25 Hydroxy (Vit-D Deficiency, Fractures)   Screening for cervical cancer - Normal Pap history.  Will repeat at 5-year interval per guidelines.  Screening for breast cancer - Normal mammogram history.  Continue annual screenings.  Normal breast exam today. Mammogram scheduled 12/08/2020.  Screening for colon cancer - Normal colonoscopy in 2017. Will repeat at GI's recommended interval.   Return in 1 year for annual.    Tamela Gammon DNP, 9:06 AM 11/14/2020

## 2020-11-15 ENCOUNTER — Telehealth: Payer: Self-pay

## 2020-11-15 ENCOUNTER — Other Ambulatory Visit: Payer: Self-pay | Admitting: Nurse Practitioner

## 2020-11-15 DIAGNOSIS — E559 Vitamin D deficiency, unspecified: Secondary | ICD-10-CM

## 2020-11-15 LAB — COMPREHENSIVE METABOLIC PANEL
AG Ratio: 1.9 (calc) (ref 1.0–2.5)
ALT: 25 U/L (ref 6–29)
AST: 21 U/L (ref 10–35)
Albumin: 4.4 g/dL (ref 3.6–5.1)
Alkaline phosphatase (APISO): 88 U/L (ref 37–153)
BUN: 11 mg/dL (ref 7–25)
CO2: 29 mmol/L (ref 20–32)
Calcium: 9.6 mg/dL (ref 8.6–10.4)
Chloride: 104 mmol/L (ref 98–110)
Creat: 0.67 mg/dL (ref 0.50–1.05)
Globulin: 2.3 g/dL (calc) (ref 1.9–3.7)
Glucose, Bld: 90 mg/dL (ref 65–99)
Potassium: 4.3 mmol/L (ref 3.5–5.3)
Sodium: 140 mmol/L (ref 135–146)
Total Bilirubin: 1 mg/dL (ref 0.2–1.2)
Total Protein: 6.7 g/dL (ref 6.1–8.1)

## 2020-11-15 LAB — CBC WITH DIFFERENTIAL/PLATELET
Absolute Monocytes: 387 cells/uL (ref 200–950)
Basophils Absolute: 21 cells/uL (ref 0–200)
Basophils Relative: 0.4 %
Eosinophils Absolute: 138 cells/uL (ref 15–500)
Eosinophils Relative: 2.6 %
HCT: 43.5 % (ref 35.0–45.0)
Hemoglobin: 14.1 g/dL (ref 11.7–15.5)
Lymphs Abs: 1754 cells/uL (ref 850–3900)
MCH: 30.5 pg (ref 27.0–33.0)
MCHC: 32.4 g/dL (ref 32.0–36.0)
MCV: 94.2 fL (ref 80.0–100.0)
MPV: 11.5 fL (ref 7.5–12.5)
Monocytes Relative: 7.3 %
Neutro Abs: 3000 cells/uL (ref 1500–7800)
Neutrophils Relative %: 56.6 %
Platelets: 106 10*3/uL — ABNORMAL LOW (ref 140–400)
RBC: 4.62 10*6/uL (ref 3.80–5.10)
RDW: 13.1 % (ref 11.0–15.0)
Total Lymphocyte: 33.1 %
WBC: 5.3 10*3/uL (ref 3.8–10.8)

## 2020-11-15 LAB — LIPID PANEL
Cholesterol: 291 mg/dL — ABNORMAL HIGH (ref <200)
HDL: 72 mg/dL (ref >=50)
LDL Cholesterol (Calc): 184 mg/dL (calc) — ABNORMAL HIGH
Non-HDL Cholesterol (Calc): 219 mg/dL (calc) — ABNORMAL HIGH (ref <130)
Total CHOL/HDL Ratio: 4 (calc) (ref <5.0)
Triglycerides: 190 mg/dL — ABNORMAL HIGH (ref <150)

## 2020-11-15 LAB — TSH: TSH: 3.42 mIU/L

## 2020-11-15 LAB — VITAMIN D 25 HYDROXY (VIT D DEFICIENCY, FRACTURES): Vit D, 25-Hydroxy: 22 ng/mL — ABNORMAL LOW (ref 30–100)

## 2020-11-15 MED ORDER — VITAMIN D (ERGOCALCIFEROL) 1.25 MG (50000 UNIT) PO CAPS: 50000.0000 [IU] | ORAL_CAPSULE | ORAL | 0 refills | Status: AC

## 2020-11-15 MED ORDER — NONFORMULARY OR COMPOUNDED ITEM: 4 refills | Status: DC

## 2020-11-15 NOTE — Telephone Encounter (Signed)
Patient spoke with you at visit about est vag cream but checked with pharmacy and it is not there. She wants to get it at [facility].  Your note "Menopausal vaginal dryness - We discussed compounded vaginal estrogen cream and she would like to try. Instructions are to use nightly x 2 weeks, then decrease to every other night x 2 weeks, and then twice weekly for maintenance."   (One gram is considered a dose. If you want to prescribe less let me know and I will change the Rx directions. We have to phone it in. I usually call it in for 90 days supply and they calculate how much she needs and only give her that much. If you want 30 days let me know.)

## 2020-11-15 NOTE — Telephone Encounter (Signed)
I phoned the Rx in and called patient and notified her that I provided the pharmacy her phone number and they will let her know when it is ready.

## 2020-11-15 NOTE — Telephone Encounter (Signed)
I sent a message to [facility] yesterday requesting this. Yes, please send with refills for the year and keep at 1 gram dose. Thank you!

## 2020-12-08 ENCOUNTER — Other Ambulatory Visit: Payer: Self-pay

## 2020-12-08 ENCOUNTER — Ambulatory Visit
Admission: RE | Admit: 2020-12-08 | Discharge: 2020-12-08 | Disposition: A | Discharge status: Home / Self Care | Payer: PRIVATE HEALTH INSURANCE | Source: Ambulatory Visit | Attending: Nurse Practitioner | Admitting: Nurse Practitioner

## 2020-12-08 DIAGNOSIS — Z1231 Encounter for screening mammogram for malignant neoplasm of breast: Secondary | ICD-10-CM

## 2021-03-02 ENCOUNTER — Other Ambulatory Visit: Payer: Self-pay | Admitting: *Deleted

## 2021-03-02 MED ORDER — NONFORMULARY OR COMPOUNDED ITEM: 3 refills | Status: DC

## 2021-03-02 NOTE — Telephone Encounter (Signed)
It appears patient hs 90 day supply, but not additional refills. Refills called in.

## 2021-03-14 ENCOUNTER — Telehealth: Payer: Self-pay | Admitting: *Deleted

## 2021-03-14 MED ORDER — ESTRADIOL 0.1 MG/GM VA CREA: TOPICAL_CREAM | VAGINAL | 3 refills | Status: DC

## 2021-03-14 NOTE — Telephone Encounter (Signed)
Rx sent, patient informed.

## 2021-03-14 NOTE — Telephone Encounter (Signed)
Yes, please switch.Thank you.

## 2021-03-14 NOTE — Telephone Encounter (Signed)
Beth Wilkerson said they are not longer "making compound estradiol vaginal cream 0.02%"  patient can have estradiol 0.1 mg 1 gram vaginally cream. I discussed with patient and she is okay with switching to this strength. Okay to switch?

## 2021-05-09 ENCOUNTER — Ambulatory Visit (HOSPITAL_COMMUNITY)
Admission: EM | Admit: 2021-05-09 | Discharge: 2021-05-09 | Disposition: A | Discharge status: Home / Self Care | Payer: PRIVATE HEALTH INSURANCE | Attending: Family Medicine | Admitting: Family Medicine

## 2021-05-09 ENCOUNTER — Other Ambulatory Visit: Payer: Self-pay

## 2021-05-09 ENCOUNTER — Encounter (HOSPITAL_COMMUNITY): Payer: Self-pay

## 2021-05-09 DIAGNOSIS — J209 Acute bronchitis, unspecified: Secondary | ICD-10-CM | POA: Diagnosis not present

## 2021-05-09 DIAGNOSIS — J069 Acute upper respiratory infection, unspecified: Secondary | ICD-10-CM

## 2021-05-09 MED ORDER — PROMETHAZINE-DM 6.25-15 MG/5ML PO SYRP: 5.0000 mL | ORAL_SOLUTION | Freq: Four times a day (QID) | ORAL | 0 refills | Status: DC | PRN

## 2021-05-09 MED ORDER — PREDNISONE 20 MG PO TABS: 40.0000 mg | ORAL_TABLET | Freq: Every day | ORAL | 0 refills | Status: DC

## 2021-05-09 NOTE — ED Provider Notes (Signed)
[facility]    CSN: [id number] Arrival date & time: 05/09/21  1038      History   Chief Complaint Chief Complaint  Patient presents with   Cough    HPI Beth Wilkerson is a 55 y.o. female.   Patient presenting today with 5-day history of productive cough, thick nasal congestion, occasionally bloody sputum, headaches, nausea, fatigue, body aches.  Denies chest pain, significant shortness of breath, abdominal pain, diarrhea.  No known sick contacts recently.  No known chronic medical problems.  Taking DayQuil, NyQuil with mild temporary relief of symptoms.   Past Medical History:  Diagnosis Date   Migraines     Patient Active Problem List   Diagnosis Date Noted   Hemorrhoids 08/26/2013    Past Surgical History:  Procedure Laterality Date   APPENDECTOMY      OB History     Gravida  1   Para  1   Term      Preterm      AB      Living  1      SAB      IAB      Ectopic      Multiple      Live Births               Home Medications    Prior to Admission medications   Medication Sig Start Date End Date Taking? Authorizing Provider  predniSONE (DELTASONE) 20 MG tablet Take 2 tablets (40 mg total) by mouth daily with breakfast. 05/09/21  Yes Volney American, PA-C  promethazine-dextromethorphan (PROMETHAZINE-DM) 6.25-15 MG/5ML syrup Take 5 mLs by mouth 4 (four) times daily as needed for cough. 05/09/21  Yes Volney American, PA-C  estradiol (ESTRACE) 0.1 MG/GM vaginal cream Insert 1 gram vaginally twice weekly. 03/14/21   Tamela Gammon, NP    Family History Family History  Problem Relation Age of Onset   Cancer Mother        bladder   Colon cancer Neg Hx     Social History Social History   Tobacco Use   Smoking status: Never   Smokeless tobacco: Never  Vaping Use   Vaping Use: Never used  Substance Use Topics   Alcohol use: Yes    Alcohol/week: 0.0 standard drinks    Comment: Social   Drug use: No      Allergies   Patient has no known allergies.   Review of Systems Review of Systems Per HPI  Physical Exam Triage Vital Signs ED Triage Vitals  Enc Vitals Group     BP 05/09/21 1158 (!) 152/91     Pulse Rate 05/09/21 1158 90     Resp 05/09/21 1158 18     Temp 05/09/21 1158 99.5 F (37.5 C)     Temp Source 05/09/21 1158 Oral     SpO2 05/09/21 1158 96 %     Weight --      Height --      Head Circumference --      Peak Flow --      Pain Score 05/09/21 1159 7     Pain Loc --      Pain Edu? --      Excl. in [place]? --    No data found.  Updated Vital Signs BP (!) 152/91 (BP Location: Left Arm)   Pulse 90   Temp 99.5 F (37.5 C) (Oral)   Resp 18   LMP 09/21/2014   SpO2  96%   Visual Acuity Right Eye Distance:   Left Eye Distance:   Bilateral Distance:    Right Eye Near:   Left Eye Near:    Bilateral Near:     Physical Exam Vitals and nursing note reviewed.  Constitutional:      Appearance: Normal appearance. She is not ill-appearing.  HENT:     Head: Atraumatic.     Right Ear: Tympanic membrane normal.     Left Ear: Tympanic membrane normal.     Nose: Rhinorrhea present.     Mouth/Throat:     Mouth: Mucous membranes are moist.     Pharynx: Posterior oropharyngeal erythema present.  Eyes:     Extraocular Movements: Extraocular movements intact.     Conjunctiva/sclera: Conjunctivae normal.  Cardiovascular:     Rate and Rhythm: Normal rate and regular rhythm.     Heart sounds: Normal heart sounds.  Pulmonary:     Effort: Pulmonary effort is normal. No respiratory distress.     Breath sounds: Normal breath sounds. No wheezing or rales.  Musculoskeletal:        General: Normal range of motion.     Cervical back: Normal range of motion and neck supple.  Skin:    General: Skin is warm and dry.  Neurological:     Mental Status: She is alert and oriented to person, place, and time.     Motor: No weakness.     Gait: Gait normal.  Psychiatric:         Mood and Affect: Mood normal.        Thought Content: Thought content normal.        Judgment: Judgment normal.     UC Treatments / Results  Labs (all labs ordered are listed, but only abnormal results are displayed) Labs Reviewed - No data to display  EKG   Radiology No results found.  Procedures Procedures (including critical care time)  Medications Ordered in UC Medications - No data to display  Initial Impression / Assessment and Plan / UC Course  I have reviewed the triage vital signs and the nursing notes.  Pertinent labs & imaging results that were available during my care of the patient were reviewed by me and considered in my medical decision making (see chart for details).     Suspect postviral bronchitis, will forego viral testing given duration of symptoms today.  Treat with prednisone, Phenergan DM, continue DayQuil, NyQuil, supportive home care.  Return for acutely worsening symptoms.  Work note given.  Final Clinical Impressions(s) / UC Diagnoses   Final diagnoses:  Viral URI with cough  Acute bronchitis, unspecified organism   Discharge Instructions   None    ED Prescriptions     Medication Sig Dispense Auth. Provider   predniSONE (DELTASONE) 20 MG tablet Take 2 tablets (40 mg total) by mouth daily with breakfast. 10 tablet Volney American, PA-C   promethazine-dextromethorphan (PROMETHAZINE-DM) 6.25-15 MG/5ML syrup Take 5 mLs by mouth 4 (four) times daily as needed for cough. 100 mL Volney American, [place]      PDMP not reviewed this encounter.   Volney American, [place] 05/09/21 1306

## 2021-05-09 NOTE — ED Triage Notes (Signed)
Pt c/o productive cough with green/bloody sputum x5 days. C/o headaches and nausea this am.

## 2021-12-11 ENCOUNTER — Ambulatory Visit: Payer: PRIVATE HEALTH INSURANCE | Admitting: Nurse Practitioner

## 2022-12-02 ENCOUNTER — Other Ambulatory Visit (HOSPITAL_COMMUNITY)
Admission: RE | Admit: 2022-12-02 | Discharge: 2022-12-02 | Disposition: A | Discharge status: Home / Self Care | Payer: PRIVATE HEALTH INSURANCE | Source: Ambulatory Visit | Attending: Nurse Practitioner | Admitting: Nurse Practitioner

## 2022-12-02 ENCOUNTER — Ambulatory Visit (INDEPENDENT_AMBULATORY_CARE_PROVIDER_SITE_OTHER): Payer: PRIVATE HEALTH INSURANCE | Admitting: Nurse Practitioner

## 2022-12-02 ENCOUNTER — Encounter: Payer: Self-pay | Admitting: Nurse Practitioner

## 2022-12-02 VITALS — BP 126/72 | HR 63 | Ht <= 58 in | Wt 132.0 lb

## 2022-12-02 DIAGNOSIS — Z780 Asymptomatic menopausal state: Secondary | ICD-10-CM

## 2022-12-02 DIAGNOSIS — N951 Menopausal and female climacteric states: Secondary | ICD-10-CM | POA: Diagnosis not present

## 2022-12-02 DIAGNOSIS — E559 Vitamin D deficiency, unspecified: Secondary | ICD-10-CM

## 2022-12-02 DIAGNOSIS — Z01419 Encounter for gynecological examination (general) (routine) without abnormal findings: Secondary | ICD-10-CM | POA: Diagnosis not present

## 2022-12-02 DIAGNOSIS — E785 Hyperlipidemia, unspecified: Secondary | ICD-10-CM | POA: Diagnosis not present

## 2022-12-02 DIAGNOSIS — Z124 Encounter for screening for malignant neoplasm of cervix: Secondary | ICD-10-CM

## 2022-12-02 MED ORDER — ESTRADIOL 0.1 MG/GM VA CREA
TOPICAL_CREAM | VAGINAL | 3 refills | Status: AC
Start: 2022-12-02 — End: ?

## 2022-12-02 NOTE — Progress Notes (Signed)
   Beth Wilkerson 09/04/1965 [id number]   History:  57 y.o. G1P1 presents for annual exam. Postmenopausal. Started on vaginal estrogen last year dryness and painful intercourse with improvement. Normal pap and mammogram history. H/O vitamin D deficiency.   Gynecologic History Patient's last menstrual period was 09/21/2014.   Contraception/Family planning: post menopausal status Sexually active: Yes  Health Maintenance Last Pap: 09/30/2016. Results were: Normal Last mammogram: 12/08/2020. Results were: Normal Last colonoscopy: 2017. Results were: normal, 10-year recall Last Dexa: Not indicated  Past medical history, past surgical history, family history and social history were all reviewed and documented in the EPIC chart. Married. Works in data entry. 48 yo son, just married, currently living with patient with plans to move to [place] later this year. From [place].   ROS:  A ROS was performed and pertinent positives and negatives are included.  Exam:  Vitals:   12/02/22 1154  BP: 126/72  Pulse: 63  SpO2: 100%  Weight: 132 lb (59.9 kg)  Height: 4\' 10"  (1.473 m)    Body mass index is 27.59 kg/m.  General appearance:  Normal Thyroid:  Symmetrical, normal in size, without palpable masses or nodularity. Respiratory  Auscultation:  Clear without wheezing or rhonchi Cardiovascular  Auscultation:  Regular rate, without rubs, murmurs or gallops  Edema/varicosities:  Not grossly evident Abdominal  Soft,nontender, without masses, guarding or rebound.  Liver/spleen:  No organomegaly noted  Hernia:  None appreciated  Skin  Inspection:  Grossly normal Breasts: Examined lying and sitting.   Right: Without masses, retractions, nipple discharge or axillary adenopathy.   Left: Without masses, retractions, nipple discharge or axillary adenopathy. Gentitourinary   Inguinal/mons:  Normal without inguinal adenopathy  External genitalia:  Normal appearing vulva with no masses, tenderness, or  lesions  BUS/Urethra/Skene's glands:  Normal  Vagina:  Atrophic changes, no discharge, no lesions.   Cervix:  Normal appearing without discharge or lesions  Uterus:  Normal in size, shape and contour.  Midline and mobile, nontender  Adnexa/parametria:     Rt: Normal in size, without masses or tenderness.   Lt: Normal in size, without masses or tenderness.  Anus and perineum: Non-bleeding external hemorrhoids  Digital rectal exam: Deferred  Assessment/Plan:  57 y.o. G1P1 for annual exam.   Well female exam with routine gynecological exam - Plan: CBC with Differential/Platelet, Comprehensive metabolic panel. Education provided on SBEs, importance of preventative screenings, current guidelines, high calcium diet, regular exercise, and multivitamin daily.   Vitamin D deficiency - Plan: VITAMIN D 25 Hydroxy (Vit-D Deficiency, Fractures)  Hyperlipidemia, unspecified hyperlipidemia type - Plan: Lipid panel. Recommended establishing with PCP last year. She plans to do this now. Provided with list of local PCPs and instructions on scheduling. Low fat/low cholesterol diet and regular exercise recommended.   Screening for cervical cancer - Plan: Cytology - PAP( [facility]). Normal pap history.   Menopausal vaginal dryness - Plan: estradiol (ESTRACE) 0.1 MG/GM vaginal cream twice weekly with good management.   Screening for breast cancer - Normal mammogram history.  Continue annual screenings. Overdue and plans to schedule soon. Normal breast exam today.   Screening for colon cancer - Normal colonoscopy in 2017. Will repeat at GI's recommended interval.   Screening for osteoporosis - Average risk. Will plan DXA at age 89.   Return in 1 year for annual.      Olivia Mackie DNP, 12:26 PM 12/02/2022

## 2022-12-03 LAB — CBC WITH DIFFERENTIAL/PLATELET
Absolute Monocytes: 250 cells/uL (ref 200–950)
Basophils Absolute: 19 cells/uL (ref 0–200)
Basophils Relative: 0.4 %
Eosinophils Absolute: 58 cells/uL (ref 15–500)
Eosinophils Relative: 1.2 %
HCT: 43.2 % (ref 35.0–45.0)
Hemoglobin: 14.1 g/dL (ref 11.7–15.5)
Lymphs Abs: 1814 cells/uL (ref 850–3900)
MCH: 29.9 pg (ref 27.0–33.0)
MCHC: 32.6 g/dL (ref 32.0–36.0)
MCV: 91.7 fL (ref 80.0–100.0)
MPV: 12.1 fL (ref 7.5–12.5)
Monocytes Relative: 5.2 %
Neutro Abs: 2659 cells/uL (ref 1500–7800)
Neutrophils Relative %: 55.4 %
Platelets: 149 10*3/uL (ref 140–400)
RBC: 4.71 10*6/uL (ref 3.80–5.10)
RDW: 13.2 % (ref 11.0–15.0)
Total Lymphocyte: 37.8 %
WBC: 4.8 10*3/uL (ref 3.8–10.8)

## 2022-12-03 LAB — COMPREHENSIVE METABOLIC PANEL
AG Ratio: 1.8 (calc) (ref 1.0–2.5)
ALT: 14 U/L (ref 6–29)
AST: 18 U/L (ref 10–35)
Albumin: 4.4 g/dL (ref 3.6–5.1)
Alkaline phosphatase (APISO): 94 U/L (ref 37–153)
BUN: 12 mg/dL (ref 7–25)
CO2: 24 mmol/L (ref 20–32)
Calcium: 9.6 mg/dL (ref 8.6–10.4)
Chloride: 104 mmol/L (ref 98–110)
Creat: 0.66 mg/dL (ref 0.50–1.03)
Globulin: 2.4 g/dL (calc) (ref 1.9–3.7)
Glucose, Bld: 97 mg/dL (ref 65–99)
Potassium: 3.9 mmol/L (ref 3.5–5.3)
Sodium: 138 mmol/L (ref 135–146)
Total Bilirubin: 0.8 mg/dL (ref 0.2–1.2)
Total Protein: 6.8 g/dL (ref 6.1–8.1)

## 2022-12-03 LAB — LIPID PANEL
Cholesterol: 281 mg/dL — ABNORMAL HIGH (ref <200)
HDL: 89 mg/dL (ref >=50)
LDL Cholesterol (Calc): 171 mg/dL (calc) — ABNORMAL HIGH
Non-HDL Cholesterol (Calc): 192 mg/dL (calc) — ABNORMAL HIGH (ref <130)
Total CHOL/HDL Ratio: 3.2 (calc) (ref <5.0)
Triglycerides: 92 mg/dL (ref <150)

## 2022-12-03 LAB — VITAMIN D 25 HYDROXY (VIT D DEFICIENCY, FRACTURES): Vit D, 25-Hydroxy: 30 ng/mL (ref 30–100)

## 2022-12-05 LAB — CYTOLOGY - PAP
Comment: NEGATIVE
Diagnosis: NEGATIVE
High risk HPV: NEGATIVE

## 2023-02-07 IMAGING — MG MM DIGITAL SCREENING BILAT W/ TOMO AND CAD
6 of 10 series · 6 of 30 positions shown · non-contrast
Comparison: Previous exam(s).

CLINICAL DATA: Screening.

EXAM:
DIGITAL SCREENING BILATERAL MAMMOGRAM WITH TOMOSYNTHESIS AND CAD
TECHNIQUE: Bilateral screening digital craniocaudal and mediolateral oblique
mammograms were obtained. Bilateral screening digital breast
tomosynthesis was performed. The images were evaluated with
computer-aided detection.

[L CC synth-2D]
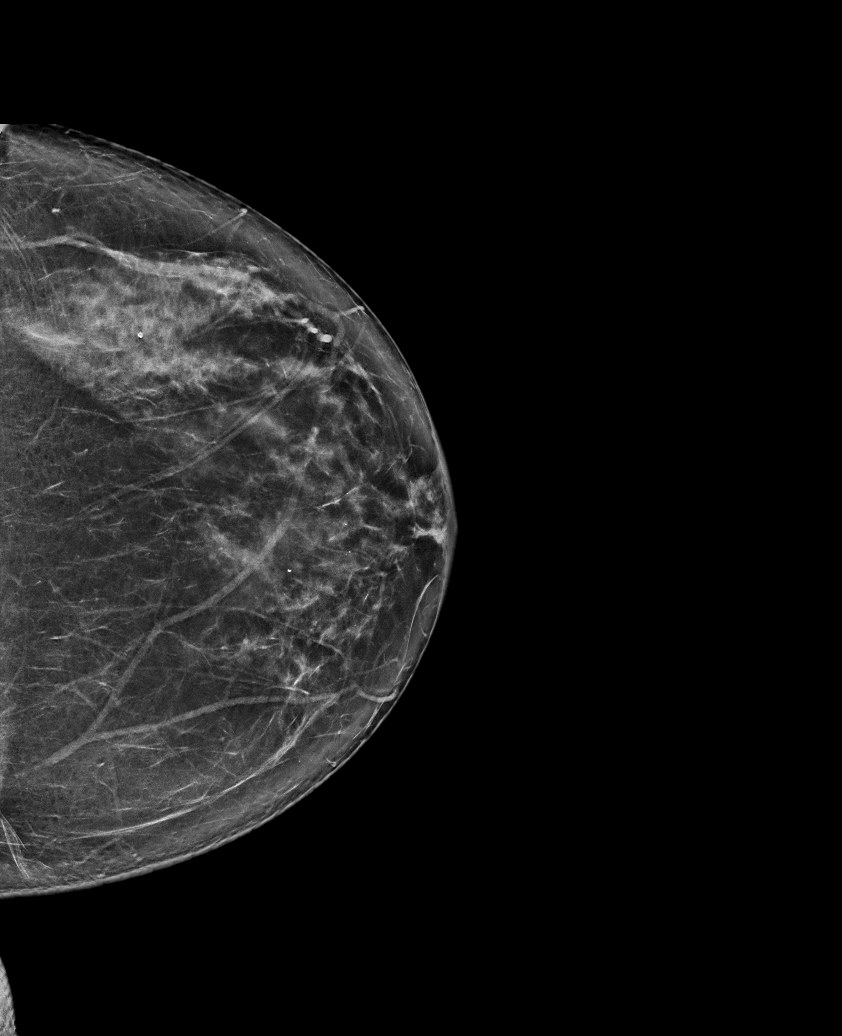

[L MLO synth-2D]
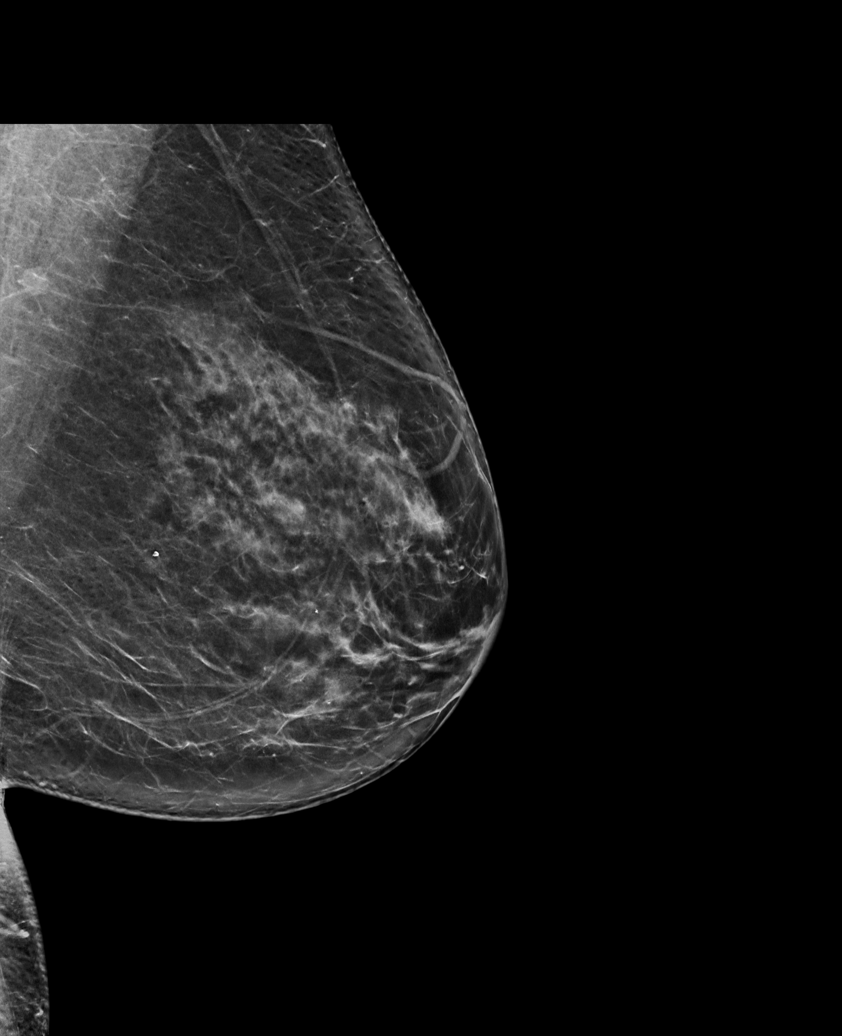

[R CC synth-2D (1 of 2)]
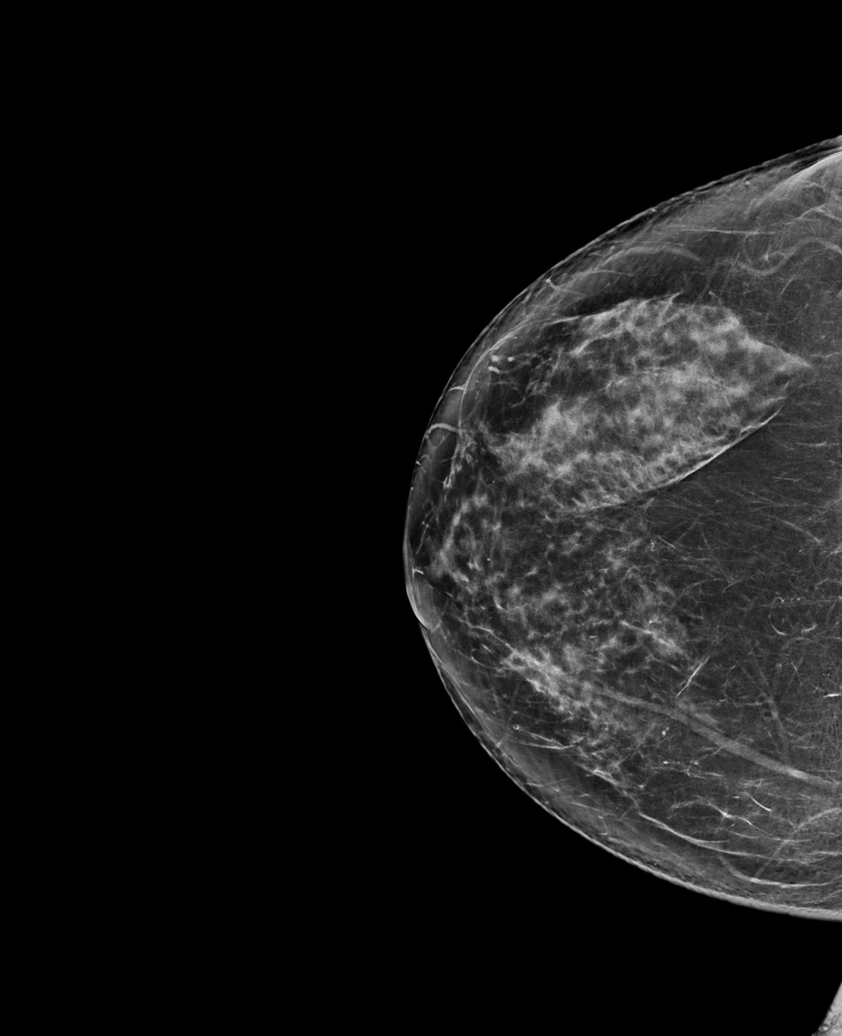

[R MLO synth-2D]
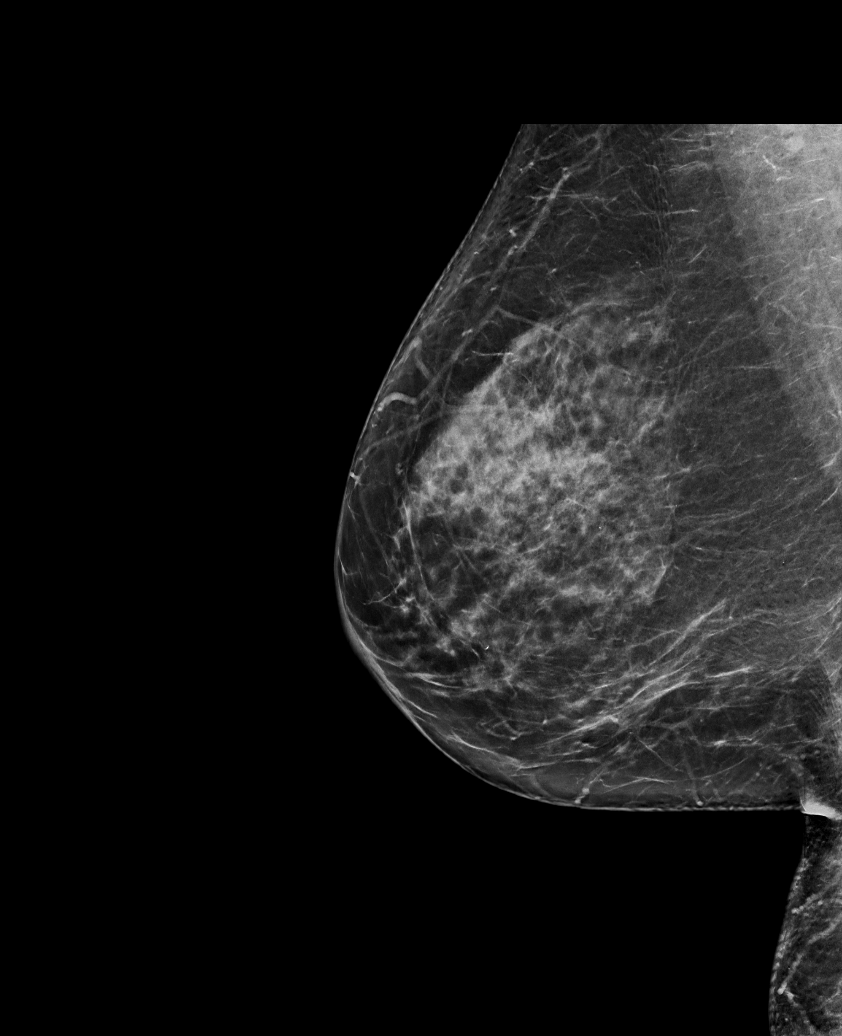

[R CC synth-2D (2 of 2)]
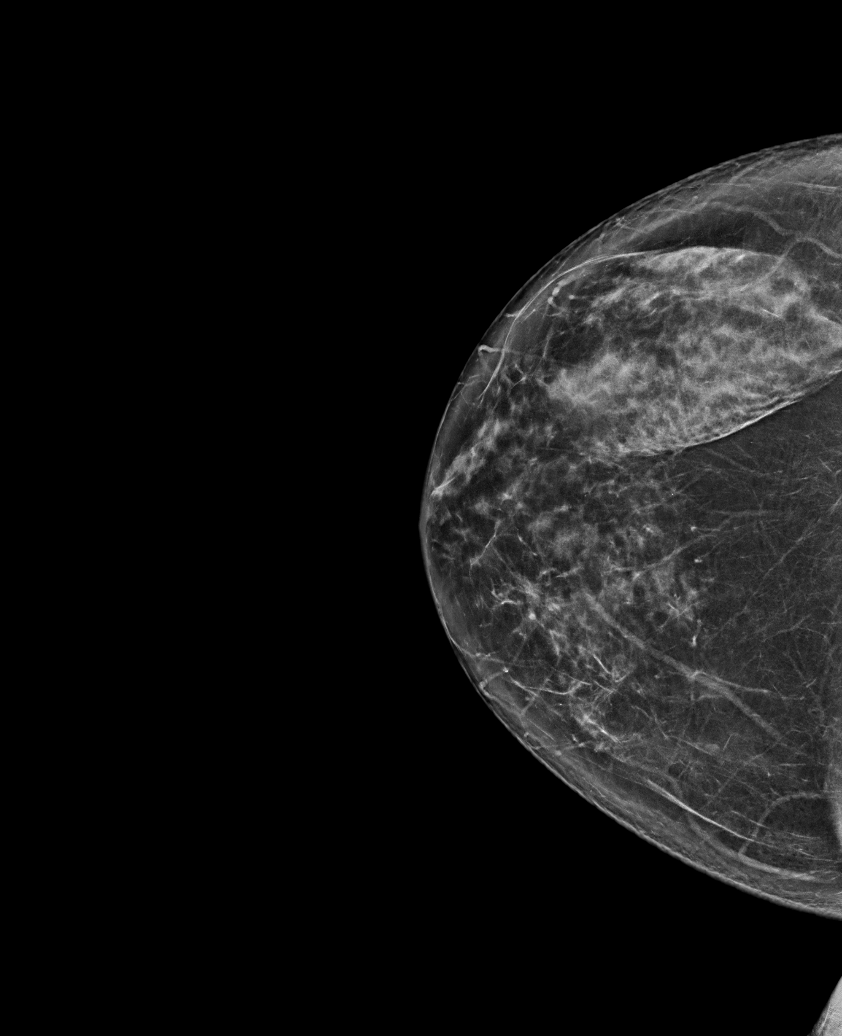

[R MLO tomo · tomo slice 43/84.0]
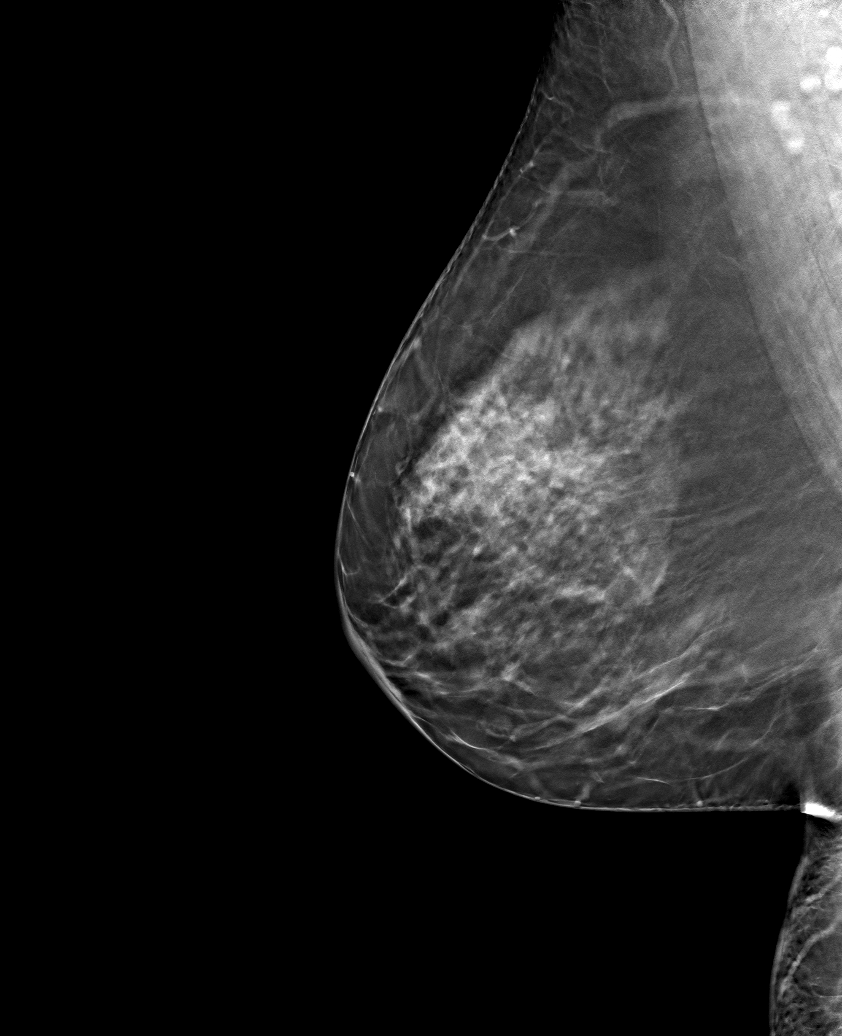

[6 of 30 positions shown; findings below may reference images not displayed]

ACR Breast Density Category c: The breast tissue is heterogeneously
dense, which may obscure small masses.
FINDINGS: There are no findings suspicious for malignancy. The images were
evaluated with computer-aided detection.
IMPRESSION: No mammographic evidence of malignancy. A result letter of this
screening mammogram will be mailed directly to the patient.

RECOMMENDATION:
Screening mammogram in one year. (Code:T4-5-GWO)

BI-RADS CATEGORY  1: Negative.
